# Patient Record
Sex: Female | Born: 1990 | Race: White | Hispanic: No | Marital: Married | Smoking: Former smoker
Health system: Southern US, Community
[De-identification: ages and names within clinical notes are randomized; demographics above are authoritative.]

## PROBLEM LIST (undated history)

## (undated) DIAGNOSIS — R87629 Unspecified abnormal cytological findings in specimens from vagina: Secondary | ICD-10-CM

## (undated) DIAGNOSIS — D649 Anemia, unspecified: Secondary | ICD-10-CM

## (undated) HISTORY — DX: Unspecified abnormal cytological findings in specimens from vagina: R87.629

## (undated) HISTORY — DX: Anemia, unspecified: D64.9

---

## 2010-10-06 HISTORY — PX: BREAST LUMPECTOMY: SHX2

## 2016-03-31 ENCOUNTER — Ambulatory Visit (INDEPENDENT_AMBULATORY_CARE_PROVIDER_SITE_OTHER): Payer: BLUE CROSS/BLUE SHIELD | Admitting: Obstetrics & Gynecology

## 2016-03-31 ENCOUNTER — Encounter: Payer: Self-pay | Admitting: Obstetrics & Gynecology

## 2016-03-31 VITALS — BP 128/77 | HR 97 | Wt 128.7 lb

## 2016-03-31 DIAGNOSIS — Z124 Encounter for screening for malignant neoplasm of cervix: Secondary | ICD-10-CM | POA: Diagnosis not present

## 2016-03-31 DIAGNOSIS — Z113 Encounter for screening for infections with a predominantly sexual mode of transmission: Secondary | ICD-10-CM

## 2016-03-31 DIAGNOSIS — N649 Disorder of breast, unspecified: Secondary | ICD-10-CM

## 2016-03-31 DIAGNOSIS — Z01419 Encounter for gynecological examination (general) (routine) without abnormal findings: Secondary | ICD-10-CM

## 2016-03-31 LAB — CBC
HCT: 29.5 % — ABNORMAL LOW (ref 35.0–45.0)
Hemoglobin: 7.8 g/dL — ABNORMAL LOW (ref 11.7–15.5)
MCH: 15.4 pg — ABNORMAL LOW (ref 27.0–33.0)
MCHC: 26.4 g/dL — AB (ref 32.0–36.0)
MCV: 58.3 fL — ABNORMAL LOW (ref 80.0–100.0)
PLATELETS: 193 10*3/uL (ref 140–400)
RBC: 5.06 MIL/uL (ref 3.80–5.10)
RDW: 20.1 % — ABNORMAL HIGH (ref 11.0–15.0)
WBC: 5.5 10*3/uL (ref 3.8–10.8)

## 2016-03-31 NOTE — Addendum Note (Signed)
Addended by: Jaynie CollinsANYANWU, UGONNA A on: 03/31/2016 01:48 PM   Modules accepted: Orders

## 2016-03-31 NOTE — Progress Notes (Addendum)
GYNECOLOGY CLINIC ANNUAL PREVENTATIVE CARE ENCOUNTER NOTE  Subjective:   Sharon Khan is a 25 y.o. G0 female here for a routine annual gynecologic exam.  Current complaints: none.   Denies abnormal vaginal bleeding, discharge, pelvic pain, or other gynecologic concerns.    Gynecologic History Patient's last menstrual period was 03/29/2016 (exact date). Contraception: abstinence, declines any other modality [place] were: normal  Obstetric History OB History  No data available    Past Medical History  Diagnosis Date  . Anemia   . Vaginal Pap smear, abnormal     Past Surgical History  Procedure Laterality Date  . Breast lumpectomy  2012    No current outpatient prescriptions on file prior to visit.   No current facility-administered medications on file prior to visit.    No Known Allergies  Social History   Social History  . Marital Status: Single    Spouse Name: N/A  . Number of Children: N/A  . Years of Education: N/A   Occupational History  . Not on file.   Social History Main Topics  . Smoking status: Current Every Day Smoker -- 1.00 packs/day for 4 years    Types: Cigarettes  . Smokeless tobacco: Never Used  . Alcohol Use: 0.0 oz/week    0 Standard drinks or equivalent per week     Comment: socially   . Drug Use: 3.00 per week    Special: Marijuana  . Sexual Activity:    Partners: Male    Pharmacist, hospitalBirth Control/ Protection: None   Other Topics Concern  . Not on file   Social History Narrative  . No narrative on file    History reviewed. No pertinent family history.  The following portions of the patient's history were reviewed and updated as appropriate: allergies, current medications, past family history, past medical history, past social history, past surgical history and problem list.  Review of Systems Pertinent items noted in HPI and remainder of comprehensive ROS otherwise negative.   Objective:  BP 128/77 mmHg   Pulse 97  Wt 128 lb 11.2 oz (58.378 kg)  LMP 03/29/2016 (Exact Date) CONSTITUTIONAL: Well-developed, well-nourished female in no acute distress.  HENT:  Normocephalic, atraumatic, External right and left ear normal. Oropharynx is clear and moist EYES: Conjunctivae and EOM are normal. Pupils are equal, round, and reactive to light. No scleral icterus.  NECK: Normal range of motion, supple, no masses.  Normal thyroid.  SKIN: Skin is warm and dry. No rash noted. Not diaphoretic. No erythema. No pallor. NEUROLOGIC: Alert and oriented to person, place, and time. Normal reflexes, muscle tone coordination. No cranial nerve deficit noted. PSYCHIATRIC: Normal mood and affect. Normal behavior. Normal judgment and thought content. CARDIOVASCULAR: Normal heart rate noted, regular rhythm RESPIRATORY: Clear to auscultation bilaterally. Effort and breath sounds normal, no problems with respiration noted. BREASTS: Symmetric in size. Abnormal breast lesion on right breast 1.5 cm mobile lesion at 6 o'clock position about 2 cm from nipple. No other masses, skin changes, nipple drainage, or lymphadenopathy. ABDOMEN: Soft, normal bowel sounds, no distention noted.  No tenderness, rebound or guarding.  PELVIC: Normal appearing external genitalia; normal appearing vaginal mucosa and cervix. Currently on period, moderate amount of blood in vagina cleared with scopettes..  Pap smear obtained.  Normal uterine size, no other palpable masses, no uterine or adnexal tenderness. MUSCULOSKELETAL: Normal range of motion. No tenderness.  No cyanosis, clubbing, or edema.  2+ distal pulses.  Assessment:  Annual  gynecologic examination with pap smear Desires STI screen R breast lesion   Plan:  Will follow up results of pap smear and manage accordingly. STI screen ordered. Breast imaging studies ordered for right breast lesion; will follow up results and manage accordingly. Routine preventative health maintenance measures  emphasized. Please refer to After Visit Summary for other counseling recommendations.    Jaynie CollinsUGONNA  Adriann Thau, MD, FACOG Attending Obstetrician & Gynecologist,  [facility]

## 2016-03-31 NOTE — Patient Instructions (Signed)
Preventive Care for Adults, Female A healthy lifestyle and preventive care can promote health and wellness. Preventive health guidelines for women include the following key practices.  A routine yearly physical is a good way to check with your health care provider about your health and preventive screening. It is a chance to share any concerns and updates on your health and to receive a thorough exam.  Visit your dentist for a routine exam and preventive care every 6 months. Brush your teeth twice a day and floss once a day. Good oral hygiene prevents tooth decay and gum disease.  The frequency of eye exams is based on your age, health, family medical history, use of contact lenses, and other factors. Follow your health care provider's recommendations for frequency of eye exams.  Eat a healthy diet. Foods like vegetables, fruits, whole grains, low-fat dairy products, and lean protein foods contain the nutrients you need without too many calories. Decrease your intake of foods high in solid fats, added sugars, and salt. Eat the right amount of calories for you.Get information about a proper diet from your health care provider, if necessary.  Regular physical exercise is one of the most important things you can do for your health. Most adults should get at least 150 minutes of moderate-intensity exercise (any activity that increases your heart rate and causes you to sweat) each week. In addition, most adults need muscle-strengthening exercises on 2 or more days a week.  Maintain a healthy weight. The body mass index (BMI) is a screening tool to identify possible weight problems. It provides an estimate of body fat based on height and weight. Your health care provider can find your BMI and can help you achieve or maintain a healthy weight.For adults 20 years and older:  A BMI below 18.5 is considered underweight.  A BMI of 18.5 to 24.9 is normal.  A BMI of 25 to 29.9 is considered overweight.  A  BMI of 30 and above is considered obese.  Maintain normal blood lipids and cholesterol levels by exercising and minimizing your intake of saturated fat. Eat a balanced diet with plenty of fruit and vegetables. Blood tests for lipids and cholesterol should begin at age 45 and be repeated every 5 years. If your lipid or cholesterol levels are high, you are over 50, or you are at high risk for heart disease, you may need your cholesterol levels checked more frequently.Ongoing high lipid and cholesterol levels should be treated with medicines if diet and exercise are not working.  If you smoke, find out from your health care provider how to quit. If you do not use tobacco, do not start.  Lung cancer screening is recommended for adults aged 45-80 years who are at high risk for developing lung cancer because of a history of smoking. A yearly low-dose CT scan of the lungs is recommended for people who have at least a 30-pack-year history of smoking and are a current smoker or have quit within the past 15 years. A pack year of smoking is smoking an average of 1 pack of cigarettes a day for 1 year (for example: 1 pack a day for 30 years or 2 packs a day for 15 years). Yearly screening should continue until the smoker has stopped smoking for at least 15 years. Yearly screening should be stopped for people who develop a health problem that would prevent them from having lung cancer treatment.  If you are pregnant, do not drink alcohol. If you are  breastfeeding, be very cautious about drinking alcohol. If you are not pregnant and choose to drink alcohol, do not have more than 1 drink per day. One drink is considered to be 12 ounces (355 mL) of beer, 5 ounces (148 mL) of wine, or 1.5 ounces (44 mL) of liquor.  Avoid use of street drugs. Do not share needles with anyone. Ask for help if you need support or instructions about stopping the use of drugs.  High blood pressure causes heart disease and increases the risk  of stroke. Your blood pressure should be checked at least every 1 to 2 years. Ongoing high blood pressure should be treated with medicines if weight loss and exercise do not work.  If you are 55-79 years old, ask your health care provider if you should take aspirin to prevent strokes.  Diabetes screening is done by taking a blood sample to check your blood glucose level after you have not eaten for a certain period of time (fasting). If you are not overweight and you do not have risk factors for diabetes, you should be screened once every 3 years starting at age 45. If you are overweight or obese and you are 40-70 years of age, you should be screened for diabetes every year as part of your cardiovascular risk assessment.  Breast cancer screening is essential preventive care for women. You should practice "breast self-awareness." This means understanding the normal appearance and feel of your breasts and may include breast self-examination. Any changes detected, no matter how small, should be reported to a health care provider. Women in their 20s and 30s should have a clinical breast exam (CBE) by a health care provider as part of a regular health exam every 1 to 3 years. After age 40, women should have a CBE every year. Starting at age 40, women should consider having a mammogram (breast X-ray test) every year. Women who have a family history of breast cancer should talk to their health care provider about genetic screening. Women at a high risk of breast cancer should talk to their health care providers about having an MRI and a mammogram every year.  Breast cancer gene (BRCA)-related cancer risk assessment is recommended for women who have family members with BRCA-related cancers. BRCA-related cancers include breast, ovarian, tubal, and peritoneal cancers. Having family members with these cancers may be associated with an increased risk for harmful changes (mutations) in the breast cancer genes BRCA1 and  BRCA2. Results of the assessment will determine the need for genetic counseling and BRCA1 and BRCA2 testing.  Your health care provider may recommend that you be screened regularly for cancer of the pelvic organs (ovaries, uterus, and vagina). This screening involves a pelvic examination, including checking for microscopic changes to the surface of your cervix (Pap test). You may be encouraged to have this screening done every 3 years, beginning at age 21.  For women ages 30-65, health care providers may recommend pelvic exams and Pap testing every 3 years, or they may recommend the Pap and pelvic exam, combined with testing for human papilloma virus (HPV), every 5 years. Some types of HPV increase your risk of cervical cancer. Testing for HPV may also be done on women of any age with unclear Pap test results.  Other health care providers may not recommend any screening for nonpregnant women who are considered low risk for pelvic cancer and who do not have symptoms. Ask your health care provider if a screening pelvic exam is right for   you.  If you have had past treatment for cervical cancer or a condition that could lead to cancer, you need Pap tests and screening for cancer for at least 20 years after your treatment. If Pap tests have been discontinued, your risk factors (such as having a new sexual partner) need to be reassessed to determine if screening should resume. Some women have medical problems that increase the chance of getting cervical cancer. In these cases, your health care provider may recommend more frequent screening and Pap tests.  Colorectal cancer can be detected and often prevented. Most routine colorectal cancer screening begins at the age of 50 years and continues through age 75 years. However, your health care provider may recommend screening at an earlier age if you have risk factors for colon cancer. On a yearly basis, your health care provider may provide home test kits to check  for hidden blood in the stool. Use of a small camera at the end of a tube, to directly examine the colon (sigmoidoscopy or colonoscopy), can detect the earliest forms of colorectal cancer. Talk to your health care provider about this at age 50, when routine screening begins. Direct exam of the colon should be repeated every 5-10 years through age 75 years, unless early forms of precancerous polyps or small growths are found.  People who are at an increased risk for hepatitis B should be screened for this virus. You are considered at high risk for hepatitis B if:  You were born in a country where hepatitis B occurs often. Talk with your health care provider about which countries are considered high risk.  Your parents were born in a high-risk country and you have not received a shot to protect against hepatitis B (hepatitis B vaccine).  You have HIV or AIDS.  You use needles to inject street drugs.  You live with, or have sex with, someone who has hepatitis B.  You get hemodialysis treatment.  You take certain medicines for conditions like cancer, organ transplantation, and autoimmune conditions.  Hepatitis C blood testing is recommended for all people born from 1945 through 1965 and any individual with known risks for hepatitis C.  Practice safe sex. Use condoms and avoid high-risk sexual practices to reduce the spread of sexually transmitted infections (STIs). STIs include gonorrhea, chlamydia, syphilis, trichomonas, herpes, HPV, and human immunodeficiency virus (HIV). Herpes, HIV, and HPV are viral illnesses that have no cure. They can result in disability, cancer, and death.  You should be screened for sexually transmitted illnesses (STIs) including gonorrhea and chlamydia if:  You are sexually active and are younger than 24 years.  You are older than 24 years and your health care provider tells you that you are at risk for this type of infection.  Your sexual activity has changed  since you were last screened and you are at an increased risk for chlamydia or gonorrhea. Ask your health care provider if you are at risk.  If you are at risk of being infected with HIV, it is recommended that you take a prescription medicine daily to prevent HIV infection. This is called preexposure prophylaxis (PrEP). You are considered at risk if:  You are sexually active and do not regularly use condoms or know the HIV status of your partner(s).  You take drugs by injection.  You are sexually active with a partner who has HIV.  Talk with your health care provider about whether you are at high risk of being infected with HIV. If   you choose to begin PrEP, you should first be tested for HIV. You should then be tested every 3 months for as long as you are taking PrEP.  Osteoporosis is a disease in which the bones lose minerals and strength with aging. This can result in serious bone fractures or breaks. The risk of osteoporosis can be identified using a bone density scan. Women ages 67 years and over and women at risk for fractures or osteoporosis should discuss screening with their health care providers. Ask your health care provider whether you should take a calcium supplement or vitamin D to reduce the rate of osteoporosis.  Menopause can be associated with physical symptoms and risks. Hormone replacement therapy is available to decrease symptoms and risks. You should talk to your health care provider about whether hormone replacement therapy is right for you.  Use sunscreen. Apply sunscreen liberally and repeatedly throughout the day. You should seek shade when your shadow is shorter than you. Protect yourself by wearing long sleeves, pants, a wide-brimmed hat, and sunglasses year round, whenever you are outdoors.  Once a month, do a whole body skin exam, using a mirror to look at the skin on your back. Tell your health care provider of new moles, moles that have irregular borders, moles that  are larger than a pencil eraser, or moles that have changed in shape or color.  Stay current with required vaccines (immunizations).  Influenza vaccine. All adults should be immunized every year.  Tetanus, diphtheria, and acellular pertussis (Td, Tdap) vaccine. Pregnant women should receive 1 dose of Tdap vaccine during each pregnancy. The dose should be obtained regardless of the length of time since the last dose. Immunization is preferred during the 27th-36th week of gestation. An adult who has not previously received Tdap or who does not know her vaccine status should receive 1 dose of Tdap. This initial dose should be followed by tetanus and diphtheria toxoids (Td) booster doses every 10 years. Adults with an unknown or incomplete history of completing a 3-dose immunization series with Td-containing vaccines should begin or complete a primary immunization series including a Tdap dose. Adults should receive a Td booster every 10 years.  Varicella vaccine. An adult without evidence of immunity to varicella should receive 2 doses or a second dose if she has previously received 1 dose. Pregnant females who do not have evidence of immunity should receive the first dose after pregnancy. This first dose should be obtained before leaving the health care facility. The second dose should be obtained 4-8 weeks after the first dose.  Human papillomavirus (HPV) vaccine. Females aged 13-26 years who have not received the vaccine previously should obtain the 3-dose series. The vaccine is not recommended for use in pregnant females. However, pregnancy testing is not needed before receiving a dose. If a female is found to be pregnant after receiving a dose, no treatment is needed. In that case, the remaining doses should be delayed until after the pregnancy. Immunization is recommended for any person with an immunocompromised condition through the age of 61 years if she did not get any or all doses earlier. During the  3-dose series, the second dose should be obtained 4-8 weeks after the first dose. The third dose should be obtained 24 weeks after the first dose and 16 weeks after the second dose.  Zoster vaccine. One dose is recommended for adults aged 30 years or older unless certain conditions are present.  Measles, mumps, and rubella (MMR) vaccine. Adults born  before 1957 generally are considered immune to measles and mumps. Adults born in 1957 or later should have 1 or more doses of MMR vaccine unless there is a contraindication to the vaccine or there is laboratory evidence of immunity to each of the three diseases. A routine second dose of MMR vaccine should be obtained at least 28 days after the first dose for students attending postsecondary schools, health care workers, or international travelers. People who received inactivated measles vaccine or an unknown type of measles vaccine during 1963-1967 should receive 2 doses of MMR vaccine. People who received inactivated mumps vaccine or an unknown type of mumps vaccine before 1979 and are at high risk for mumps infection should consider immunization with 2 doses of MMR vaccine. For females of childbearing age, rubella immunity should be determined. If there is no evidence of immunity, females who are not pregnant should be vaccinated. If there is no evidence of immunity, females who are pregnant should delay immunization until after pregnancy. Unvaccinated health care workers born before 1957 who lack laboratory evidence of measles, mumps, or rubella immunity or laboratory confirmation of disease should consider measles and mumps immunization with 2 doses of MMR vaccine or rubella immunization with 1 dose of MMR vaccine.  Pneumococcal 13-valent conjugate (PCV13) vaccine. When indicated, a person who is uncertain of his immunization history and has no record of immunization should receive the PCV13 vaccine. All adults 65 years of age and older should receive this  vaccine. An adult aged 19 years or older who has certain medical conditions and has not been previously immunized should receive 1 dose of PCV13 vaccine. This PCV13 should be followed with a dose of pneumococcal polysaccharide (PPSV23) vaccine. Adults who are at high risk for pneumococcal disease should obtain the PPSV23 vaccine at least 8 weeks after the dose of PCV13 vaccine. Adults older than 27 years of age who have normal immune system function should obtain the PPSV23 vaccine dose at least 1 year after the dose of PCV13 vaccine.  Pneumococcal polysaccharide (PPSV23) vaccine. When PCV13 is also indicated, PCV13 should be obtained first. All adults aged 65 years and older should be immunized. An adult younger than age 65 years who has certain medical conditions should be immunized. Any person who resides in a nursing home or long-term care facility should be immunized. An adult smoker should be immunized. People with an immunocompromised condition and certain other conditions should receive both PCV13 and PPSV23 vaccines. People with human immunodeficiency virus (HIV) infection should be immunized as soon as possible after diagnosis. Immunization during chemotherapy or radiation therapy should be avoided. Routine use of PPSV23 vaccine is not recommended for American Indians, [place] Natives, or people younger than 65 years unless there are medical conditions that require PPSV23 vaccine. When indicated, people who have unknown immunization and have no record of immunization should receive PPSV23 vaccine. One-time revaccination 5 years after the first dose of PPSV23 is recommended for people aged 19-64 years who have chronic kidney failure, nephrotic syndrome, asplenia, or immunocompromised conditions. People who received 1-2 doses of PPSV23 before age 65 years should receive another dose of PPSV23 vaccine at age 65 years or later if at least 5 years have passed since the previous dose. Doses of PPSV23 are not  needed for people immunized with PPSV23 at or after age 65 years.  Meningococcal vaccine. Adults with asplenia or persistent complement component deficiencies should receive 2 doses of quadrivalent meningococcal conjugate (MenACWY-D) vaccine. The doses should be obtained   at least 2 months apart. Microbiologists working with certain meningococcal bacteria, [place] recruits, people at risk during an outbreak, and people who travel to or live in countries with a high rate of meningitis should be immunized. A first-year college student up through age 34 years who is living in a residence hall should receive a dose if she did not receive a dose on or after her 16th birthday. Adults who have certain high-risk conditions should receive one or more doses of vaccine.  Hepatitis A vaccine. Adults who wish to be protected from this disease, have certain high-risk conditions, work with hepatitis A-infected animals, work in hepatitis A research labs, or travel to or work in countries with a high rate of hepatitis A should be immunized. Adults who were previously unvaccinated and who anticipate close contact with an international adoptee during the first 60 days after arrival in the [place] from a country with a high rate of hepatitis A should be immunized.  Hepatitis B vaccine. Adults who wish to be protected from this disease, have certain high-risk conditions, may be exposed to blood or other infectious body fluids, are household contacts or sex partners of hepatitis B positive people, are clients or workers in certain care facilities, or travel to or work in countries with a high rate of hepatitis B should be immunized.  Haemophilus influenzae type b (Hib) vaccine. A previously unvaccinated person with asplenia or sickle cell disease or having a scheduled splenectomy should receive 1 dose of Hib vaccine. Regardless of previous immunization, a recipient of a hematopoietic stem cell transplant should receive a  3-dose series 6-12 months after her successful transplant. Hib vaccine is not recommended for adults with HIV infection. Preventive Services / Frequency Ages 35 to 4 years  Blood pressure check.** / Every 3-5 years.  Lipid and cholesterol check.** / Every 5 years beginning at age 60.  Clinical breast exam.** / Every 3 years for women in their 71s and 10s.  BRCA-related cancer risk assessment.** / For women who have family members with a BRCA-related cancer (breast, ovarian, tubal, or peritoneal cancers).  Pap test.** / Every 2 years from ages 76 through 26. Every 3 years starting at age 61 through age 76 or 93 with a history of 3 consecutive normal Pap tests.  HPV screening.** / Every 3 years from ages 37 through ages 60 to 51 with a history of 3 consecutive normal Pap tests.  Hepatitis C blood test.** / For any individual with known risks for hepatitis C.  Skin self-exam. / Monthly.  Influenza vaccine. / Every year.  Tetanus, diphtheria, and acellular pertussis (Tdap, Td) vaccine.** / Consult your health care provider. Pregnant women should receive 1 dose of Tdap vaccine during each pregnancy. 1 dose of Td every 10 years.  Varicella vaccine.** / Consult your health care provider. Pregnant females who do not have evidence of immunity should receive the first dose after pregnancy.  HPV vaccine. / 3 doses over 6 months, if 93 and younger. The vaccine is not recommended for use in pregnant females. However, pregnancy testing is not needed before receiving a dose.  Measles, mumps, rubella (MMR) vaccine.** / You need at least 1 dose of MMR if you were born in 1957 or later. You may also need a 2nd dose. For females of childbearing age, rubella immunity should be determined. If there is no evidence of immunity, females who are not pregnant should be vaccinated. If there is no evidence of immunity, females who are  pregnant should delay immunization until after pregnancy.  Pneumococcal  13-valent conjugate (PCV13) vaccine.** / Consult your health care provider.  Pneumococcal polysaccharide (PPSV23) vaccine.** / 1 to 2 doses if you smoke cigarettes or if you have certain conditions.  Meningococcal vaccine.** / 1 dose if you are age 68 to 8 years and a Market researcher living in a residence hall, or have one of several medical conditions, you need to get vaccinated against meningococcal disease. You may also need additional booster doses.  Hepatitis A vaccine.** / Consult your health care provider.  Hepatitis B vaccine.** / Consult your health care provider.  Haemophilus influenzae type b (Hib) vaccine.** / Consult your health care provider. Ages 7 to 53 years  Blood pressure check.** / Every year.  Lipid and cholesterol check.** / Every 5 years beginning at age 25 years.  Lung cancer screening. / Every year if you are aged 11-80 years and have a 30-pack-year history of smoking and currently smoke or have quit within the past 15 years. Yearly screening is stopped once you have quit smoking for at least 15 years or develop a health problem that would prevent you from having lung cancer treatment.  Clinical breast exam.** / Every year after age 48 years.  BRCA-related cancer risk assessment.** / For women who have family members with a BRCA-related cancer (breast, ovarian, tubal, or peritoneal cancers).  Mammogram.** / Every year beginning at age 41 years and continuing for as long as you are in good health. Consult with your health care provider.  Pap test.** / Every 3 years starting at age 65 years through age 37 or 70 years with a history of 3 consecutive normal Pap tests.  HPV screening.** / Every 3 years from ages 72 years through ages 60 to 40 years with a history of 3 consecutive normal Pap tests.  Fecal occult blood test (FOBT) of stool. / Every year beginning at age 21 years and continuing until age 5 years. You may not need to do this test if you get  a colonoscopy every 10 years.  Flexible sigmoidoscopy or colonoscopy.** / Every 5 years for a flexible sigmoidoscopy or every 10 years for a colonoscopy beginning at age 35 years and continuing until age 48 years.  Hepatitis C blood test.** / For all people born from [birth year] through 1965 and any individual with known risks for hepatitis C.  Skin self-exam. / Monthly.  Influenza vaccine. / Every year.  Tetanus, diphtheria, and acellular pertussis (Tdap/Td) vaccine.** / Consult your health care provider. Pregnant women should receive 1 dose of Tdap vaccine during each pregnancy. 1 dose of Td every 10 years.  Varicella vaccine.** / Consult your health care provider. Pregnant females who do not have evidence of immunity should receive the first dose after pregnancy.  Zoster vaccine.** / 1 dose for adults aged 30 years or older.  Measles, mumps, rubella (MMR) vaccine.** / You need at least 1 dose of MMR if you were born in 1957 or later. You may also need a second dose. For females of childbearing age, rubella immunity should be determined. If there is no evidence of immunity, females who are not pregnant should be vaccinated. If there is no evidence of immunity, females who are pregnant should delay immunization until after pregnancy.  Pneumococcal 13-valent conjugate (PCV13) vaccine.** / Consult your health care provider.  Pneumococcal polysaccharide (PPSV23) vaccine.** / 1 to 2 doses if you smoke cigarettes or if you have certain conditions.  Meningococcal vaccine.** /  Consult your health care provider.  Hepatitis A vaccine.** / Consult your health care provider.  Hepatitis B vaccine.** / Consult your health care provider.  Haemophilus influenzae type b (Hib) vaccine.** / Consult your health care provider. Ages 64 years and over  Blood pressure check.** / Every year.  Lipid and cholesterol check.** / Every 5 years beginning at age 23 years.  Lung cancer screening. / Every year if you  are aged 16-80 years and have a 30-pack-year history of smoking and currently smoke or have quit within the past 15 years. Yearly screening is stopped once you have quit smoking for at least 15 years or develop a health problem that would prevent you from having lung cancer treatment.  Clinical breast exam.** / Every year after age 74 years.  BRCA-related cancer risk assessment.** / For women who have family members with a BRCA-related cancer (breast, ovarian, tubal, or peritoneal cancers).  Mammogram.** / Every year beginning at age 44 years and continuing for as long as you are in good health. Consult with your health care provider.  Pap test.** / Every 3 years starting at age 58 years through age 22 or 39 years with 3 consecutive normal Pap tests. Testing can be stopped between 65 and 70 years with 3 consecutive normal Pap tests and no abnormal Pap or HPV tests in the past 10 years.  HPV screening.** / Every 3 years from ages 64 years through ages 70 or 61 years with a history of 3 consecutive normal Pap tests. Testing can be stopped between 65 and 70 years with 3 consecutive normal Pap tests and no abnormal Pap or HPV tests in the past 10 years.  Fecal occult blood test (FOBT) of stool. / Every year beginning at age 40 years and continuing until age 27 years. You may not need to do this test if you get a colonoscopy every 10 years.  Flexible sigmoidoscopy or colonoscopy.** / Every 5 years for a flexible sigmoidoscopy or every 10 years for a colonoscopy beginning at age 7 years and continuing until age 32 years.  Hepatitis C blood test.** / For all people born from [birth year] through 1967 and any individual with known risks for hepatitis C.  Osteoporosis screening.** / A one-time screening for women ages 30 years and over and women at risk for fractures or osteoporosis.  Skin self-exam. / Monthly.  Influenza vaccine. / Every year.  Tetanus, diphtheria, and acellular pertussis (Tdap/Td)  vaccine.** / 1 dose of Td every 10 years.  Varicella vaccine.** / Consult your health care provider.  Zoster vaccine.** / 1 dose for adults aged 35 years or older.  Pneumococcal 13-valent conjugate (PCV13) vaccine.** / Consult your health care provider.  Pneumococcal polysaccharide (PPSV23) vaccine.** / 1 dose for all adults aged 46 years and older.  Meningococcal vaccine.** / Consult your health care provider.  Hepatitis A vaccine.** / Consult your health care provider.  Hepatitis B vaccine.** / Consult your health care provider.  Haemophilus influenzae type b (Hib) vaccine.** / Consult your health care provider. ** Family history and personal history of risk and conditions may change your health care provider's recommendations.   This information is not intended to replace advice given to you by your health care provider. Make sure you discuss any questions you have with your health care provider.   Document Released: 11/18/2001 Document Revised: 10/13/2014 Document Reviewed: 02/17/2011 Elsevier Interactive Patient Education [employer].

## 2016-04-01 LAB — GC/CHLAMYDIA PROBE AMP (~~LOC~~) NOT AT ARMC
CHLAMYDIA, DNA PROBE: NEGATIVE
NEISSERIA GONORRHEA: NEGATIVE

## 2016-04-01 LAB — HEPATITIS B SURFACE ANTIGEN: Hepatitis B Surface Ag: NEGATIVE

## 2016-04-01 LAB — HIV ANTIBODY (ROUTINE TESTING W REFLEX): HIV: NONREACTIVE

## 2016-04-01 LAB — RPR

## 2016-04-02 LAB — CYTOLOGY - PAP

## 2016-04-04 ENCOUNTER — Ambulatory Visit
Admission: RE | Admit: 2016-04-04 | Discharge: 2016-04-04 | Disposition: A | Discharge status: Home / Self Care | Payer: BLUE CROSS/BLUE SHIELD | Source: Ambulatory Visit | Attending: Obstetrics & Gynecology | Admitting: Obstetrics & Gynecology

## 2016-04-04 DIAGNOSIS — N649 Disorder of breast, unspecified: Secondary | ICD-10-CM

## 2016-09-01 ENCOUNTER — Other Ambulatory Visit: Payer: Self-pay | Admitting: Obstetrics & Gynecology

## 2016-09-01 DIAGNOSIS — N63 Unspecified lump in unspecified breast: Secondary | ICD-10-CM

## 2016-09-19 ENCOUNTER — Other Ambulatory Visit: Payer: Self-pay

## 2016-09-19 ENCOUNTER — Other Ambulatory Visit: Payer: Self-pay | Admitting: Obstetrics & Gynecology

## 2016-09-19 DIAGNOSIS — N63 Unspecified lump in unspecified breast: Secondary | ICD-10-CM

## 2016-09-22 ENCOUNTER — Ambulatory Visit
Admission: RE | Admit: 2016-09-22 | Discharge: 2016-09-22 | Disposition: A | Discharge status: Home / Self Care | Payer: BLUE CROSS/BLUE SHIELD | Source: Ambulatory Visit | Attending: Obstetrics & Gynecology | Admitting: Obstetrics & Gynecology

## 2016-09-22 DIAGNOSIS — N63 Unspecified lump in unspecified breast: Secondary | ICD-10-CM

## 2016-12-11 ENCOUNTER — Emergency Department (HOSPITAL_BASED_OUTPATIENT_CLINIC_OR_DEPARTMENT_OTHER)
Admission: EM | Admit: 2016-12-11 | Discharge: 2016-12-11 | Disposition: A | Discharge status: Home / Self Care | Payer: BLUE CROSS/BLUE SHIELD | Attending: Emergency Medicine | Admitting: Emergency Medicine

## 2016-12-11 ENCOUNTER — Emergency Department (HOSPITAL_BASED_OUTPATIENT_CLINIC_OR_DEPARTMENT_OTHER): Payer: BLUE CROSS/BLUE SHIELD

## 2016-12-11 ENCOUNTER — Encounter (HOSPITAL_BASED_OUTPATIENT_CLINIC_OR_DEPARTMENT_OTHER): Payer: Self-pay | Admitting: *Deleted

## 2016-12-11 DIAGNOSIS — N760 Acute vaginitis: Secondary | ICD-10-CM | POA: Insufficient documentation

## 2016-12-11 DIAGNOSIS — F17210 Nicotine dependence, cigarettes, uncomplicated: Secondary | ICD-10-CM | POA: Diagnosis not present

## 2016-12-11 DIAGNOSIS — F1290 Cannabis use, unspecified, uncomplicated: Secondary | ICD-10-CM | POA: Diagnosis not present

## 2016-12-11 DIAGNOSIS — R1031 Right lower quadrant pain: Secondary | ICD-10-CM | POA: Diagnosis present

## 2016-12-11 DIAGNOSIS — B9689 Other specified bacterial agents as the cause of diseases classified elsewhere: Secondary | ICD-10-CM

## 2016-12-11 LAB — COMPREHENSIVE METABOLIC PANEL
ALBUMIN: 4.1 g/dL (ref 3.5–5.0)
ALK PHOS: 55 U/L (ref 38–126)
ALT: 10 U/L — ABNORMAL LOW (ref 14–54)
ANION GAP: 5 (ref 5–15)
AST: 17 U/L (ref 15–41)
BUN: 9 mg/dL (ref 6–20)
CALCIUM: 8.4 mg/dL — AB (ref 8.9–10.3)
CO2: 24 mmol/L (ref 22–32)
Chloride: 102 mmol/L (ref 101–111)
Creatinine, Ser: 0.56 mg/dL (ref 0.44–1.00)
GFR calc non Af Amer: >60 mL/min (ref >60)
GLUCOSE: 100 mg/dL — AB (ref 65–99)
POTASSIUM: 3.5 mmol/L (ref 3.5–5.1)
Sodium: 131 mmol/L — ABNORMAL LOW (ref 135–145)
TOTAL PROTEIN: 7 g/dL (ref 6.5–8.1)
Total Bilirubin: 0.4 mg/dL (ref 0.3–1.2)

## 2016-12-11 LAB — URINALYSIS, ROUTINE W REFLEX MICROSCOPIC
BILIRUBIN URINE: NEGATIVE
Glucose, UA: NEGATIVE mg/dL
HGB URINE DIPSTICK: NEGATIVE
KETONES UR: NEGATIVE mg/dL
Leukocytes, UA: NEGATIVE
NITRITE: NEGATIVE
PROTEIN: NEGATIVE mg/dL
Specific Gravity, Urine: 1.02 (ref 1.005–1.030)
pH: 7 (ref 5.0–8.0)

## 2016-12-11 LAB — CBC WITH DIFFERENTIAL/PLATELET
Basophils Absolute: 0 10*3/uL (ref 0.0–0.1)
Basophils Relative: 1 %
EOS ABS: 0.1 10*3/uL (ref 0.0–0.7)
Eosinophils Relative: 3 %
HCT: 26.7 % — ABNORMAL LOW (ref 36.0–46.0)
Hemoglobin: 7.6 g/dL — ABNORMAL LOW (ref 12.0–15.0)
LYMPHS PCT: 23 %
Lymphs Abs: 0.9 10*3/uL (ref 0.7–4.0)
MCH: 15.9 pg — ABNORMAL LOW (ref 26.0–34.0)
MCHC: 28.5 g/dL — AB (ref 30.0–36.0)
MCV: 56 fL — ABNORMAL LOW (ref 78.0–100.0)
Monocytes Absolute: 0.4 10*3/uL (ref 0.1–1.0)
Monocytes Relative: 9 %
NEUTROS ABS: 2.5 10*3/uL (ref 1.7–7.7)
Neutrophils Relative %: 64 %
PLATELETS: 142 10*3/uL — AB (ref 150–400)
RBC: 4.77 MIL/uL (ref 3.87–5.11)
RDW: 21.9 % — ABNORMAL HIGH (ref 11.5–15.5)
WBC: 3.9 10*3/uL — AB (ref 4.0–10.5)

## 2016-12-11 LAB — WET PREP, GENITAL
Sperm: NONE SEEN
TRICH WET PREP: NONE SEEN
YEAST WET PREP: NONE SEEN

## 2016-12-11 LAB — PREGNANCY, URINE: PREG TEST UR: NEGATIVE

## 2016-12-11 LAB — LIPASE, BLOOD: Lipase: 23 U/L (ref 11–51)

## 2016-12-11 MED ORDER — SODIUM CHLORIDE 0.9 % IV BOLUS (SEPSIS)
1000.0000 mL | Freq: Once | INTRAVENOUS | Status: AC
  Administered 2016-12-11: 1000 mL via INTRAVENOUS

## 2016-12-11 MED ORDER — IOPAMIDOL (ISOVUE-300) INJECTION 61%
100.0000 mL | Freq: Once | INTRAVENOUS | Status: AC | PRN
  Administered 2016-12-11: 100 mL via INTRAVENOUS

## 2016-12-11 MED ORDER — KETOROLAC TROMETHAMINE 30 MG/ML IJ SOLN
30.0000 mg | Freq: Once | INTRAMUSCULAR | Status: AC
  Administered 2016-12-11: 30 mg via INTRAVENOUS
  Filled 2016-12-11: qty 1

## 2016-12-11 MED ORDER — NAPROXEN 500 MG PO TABS: 500.0000 mg | ORAL_TABLET | Freq: Two times a day (BID) | ORAL | 0 refills | Status: DC

## 2016-12-11 MED ORDER — METRONIDAZOLE 500 MG PO TABS: 500.0000 mg | ORAL_TABLET | Freq: Two times a day (BID) | ORAL | 0 refills | Status: DC

## 2016-12-11 MED FILL — metroNIDAZOLE 500 MG TABS: 500 | 7 days supply | Qty: 14 | Fill #0

## 2016-12-11 MED FILL — NAPROXEN 500 MG TABLET: 500 | 15 days supply | Qty: 30 | Fill #0

## 2016-12-11 NOTE — ED Triage Notes (Signed)
Abdominal pain in her lower abdomen into her right flank. Pain x 1 month ago. Urgency and scanty urine.

## 2016-12-11 NOTE — Discharge Instructions (Signed)
Pain: You may take ibuprofen or naproxen as needed for pain. Take these medications with food to avoid upset stomach. Choose only one of these medications, do not take them together. Bacterial vaginosis: Please take all of your antibiotics until finished!   You may develop abdominal discomfort or diarrhea from the antibiotic.  You may help offset this with probiotics which you can buy or get in yogurt. Do not eat or take the probiotics until 2 hours after your antibiotic.  Anemia: Your anemia seems to be consistent with previous levels. You should be taking your iron supplement, as prescribed.  Hydration: Symptoms will be intensified and complicated by dehydration. Dehydration can also extend the duration of symptoms. Drink plenty of fluids and get plenty of rest. You should be drinking at least half a liter of water an hour to stay hydrated. Electrolyte drinks are also encouraged. You should be drinking enough fluids to make your urine light yellow, almost clear. If this is not the case, you are not drinking enough water. Follow up: You should follow up with a primary care provider as soon as possible, both to establish care and to have your labs rechecked to make sure all your levels are stable.  Return to the ED: Should any symptoms worsen or other concerning symptoms arise, please proceed [facility] the emergency department at [facility].

## 2016-12-11 NOTE — ED Provider Notes (Signed)
[facility]   CSN: [id number] Arrival date & time: 12/11/16  1143     History   Chief Complaint Chief Complaint  Patient presents with  . Abdominal Pain    HPI Sharon Khan is a 26 y.o. female.  HPI   Sharon Khan is a 26 y.o. female, with a history of Anemia and abnormal Pap smear, presenting to the ED with Back pain and abdominal pain consistently worsening over the last month. Pain in the abdomen is sharp, 8 out of 10, and does not seem to radiate. She indicates this pain is in the right upper and right lower quadrants, worse in the right lower quadrant. Indicates increased white vaginal discharge that began around the same time. Pain in the back is in the right lumbar area and sometimes extending into the right scapular region. She feels as though the back and the abdominal pains are connected.  Patient also endorses some urinary urgency and frequency over the same time period. Has not tried any medications or other therapies. Endorses normal bowel movements. She is sexually active with 1 female partner. Denies nausea/vomiting, fever/chills, abnormal vaginal bleeding, or any other complaints. LMP three weeks ago.   Past Medical History:  Diagnosis Date  . Anemia   . Vaginal Pap smear, abnormal     There are no active problems to display for this patient.   Past Surgical History:  Procedure Laterality Date  . BREAST LUMPECTOMY  2012    OB History    No data available       Home Medications    Prior to Admission medications   Medication Sig Start Date End Date Taking? Authorizing Provider  ferrous sulfate 325 (65 FE) MG tablet Take 325 mg by mouth daily with breakfast.    Historical Provider, MD  metroNIDAZOLE (FLAGYL) 500 MG tablet Take 1 tablet (500 mg total) by mouth 2 (two) times daily. 12/11/16   Shawn C Joy, PA-C  naproxen (NAPROSYN) 500 MG tablet Take 1 tablet (500 mg total) by mouth 2 (two) times daily. 12/11/16   Anselm Pancoast, PA-C     Family History No family history on file.  Social History Social History  Substance Use Topics  . Smoking status: Current Every Day Smoker    Packs/day: 1.00    Years: 4.00    Types: Cigarettes  . Smokeless tobacco: Never Used  . Alcohol use 0.0 oz/week     Comment: socially      Allergies   Patient has no known allergies.   Review of Systems Review of Systems  Constitutional: Negative for chills and fever.  Respiratory: Negative for shortness of breath.   Cardiovascular: Negative for chest pain.  Gastrointestinal: Positive for abdominal pain. Negative for blood in stool, constipation, diarrhea, nausea and vomiting.  Genitourinary: Positive for frequency and urgency. Negative for difficulty urinating, dysuria, vaginal bleeding and vaginal discharge.  Musculoskeletal: Positive for back pain. Negative for neck pain.  Neurological: Negative for dizziness, weakness, light-headedness, numbness and headaches.  All other systems reviewed and are negative.   Physical Exam Updated Vital Signs BP 139/71 (BP Location: Left Arm)   Pulse 111   Temp 98.3 F (36.8 C) (Oral)   Resp 16   Ht 5\' 4"  (1.626 m)   Wt 57.6 kg   LMP 11/15/2016   SpO2 100%   BMI 21.80 kg/m   Physical Exam  Constitutional: She appears well-developed and well-nourished. No distress.  HENT:  Head: Normocephalic and atraumatic.  Eyes: Conjunctivae are normal.  Neck: Normal range of motion. Neck supple.  Cardiovascular: Normal rate, regular rhythm, normal heart sounds and intact distal pulses.   Pulmonary/Chest: Effort normal and breath sounds normal. No respiratory distress.  Abdominal: Soft. There is tenderness in the right upper quadrant and right lower quadrant. There is no guarding.  Palpation in the left lower quadrant elicits pain in the right lower quadrant, "as if the pain is being pushed over" to the right lower quadrant.  Genitourinary:  Genitourinary Comments: External genitalia  normal Vagina with discharge - Thin, white moderate discharge in vaginal vault Cervix  normal negative for cervical motion tenderness Adnexa palpated, no masses, positive for tenderness noted on the right Bladder palpated negative for tenderness Uterus palpated no masses, positive for tenderness  Otherwise normal female genitalia. RN, Jerrye Beavers, served as Biomedical engineer during exam.  Musculoskeletal: She exhibits no edema.  Lymphadenopathy:    She has no cervical adenopathy.       Right: No inguinal adenopathy present.       Left: No inguinal adenopathy present.  Neurological: She is alert.  No sensory deficits. Strength 5/5 in all extremities. No gait disturbance. Coordination intact including heel to shin and finger to nose.   Skin: Skin is warm and dry. She is not diaphoretic.  Psychiatric: She has a normal mood and affect. Her behavior is normal.  Nursing note and vitals reviewed.    ED Treatments / Results  Labs (all labs ordered are listed, but only abnormal results are displayed) Labs Reviewed  WET PREP, GENITAL - Abnormal; Notable for the following:       Result Value   Clue Cells Wet Prep HPF POC PRESENT (*)    WBC, Wet Prep HPF POC MANY (*)    All other components within normal limits  URINALYSIS, ROUTINE W REFLEX MICROSCOPIC - Abnormal; Notable for the following:    APPearance CLOUDY (*)    All other components within normal limits  COMPREHENSIVE METABOLIC PANEL - Abnormal; Notable for the following:    Sodium 131 (*)    Glucose, Bld 100 (*)    Calcium 8.4 (*)    ALT 10 (*)    All other components within normal limits  CBC WITH DIFFERENTIAL/PLATELET - Abnormal; Notable for the following:    WBC 3.9 (*)    Hemoglobin 7.6 (*)    HCT 26.7 (*)    MCV 56.0 (*)    MCH 15.9 (*)    MCHC 28.5 (*)    RDW 21.9 (*)    Platelets 142 (*)    All other components within normal limits  PREGNANCY, URINE  RPR  HIV ANTIBODY (ROUTINE TESTING)  LIPASE, BLOOD  GC/CHLAMYDIA PROBE AMP  ([place]) NOT AT [facility]   Hemoglobin  Date Value Ref Range Status  12/11/2016 7.6 (L) 12.0 - 15.0 g/dL Final  91/47/8295 7.8 (L) 11.7 - 15.5 g/dL Final    EKG  EKG Interpretation None       Radiology US Transvaginal Non-ob  Result Date: 12/11/2016 CLINICAL DATA:  One month of right adnexal pain EXAM: TRANSABDOMINAL AND TRANSVAGINAL ULTRASOUND OF PELVIS TECHNIQUE: Both transabdominal and transvaginal ultrasound examinations of the pelvis were performed. Transabdominal technique was performed for global imaging of the pelvis including uterus, ovaries, adnexal regions, and pelvic cul-de-sac. It was necessary to proceed with endovaginal exam following the transabdominal exam to visualize the uterus and right ovary. COMPARISON:  None in PACs FINDINGS: Uterus Measurements: 8.5 x 4.1 x 5.2 cm.  No fibroids or other mass visualized. Endometrium Thickness: 1.6 mm.  No focal abnormality visualized. Right ovary Measurements: 3.4 x 1.8 x 2.7 cm. Normal appearance. No adnexal mass. Left ovary Measurements: 3.6 x 2.6 x 2 cm. There is a slightly hyperechoic region within the left ovary measuring 1.6 x 1.2 x 1.1 cm. This may reflect the sequelae of a hemorrhagic cyst but it is nonspecific. It is not as hyperechoic as would be expected with a dermoid. Other findings There is a moderate amount of free pelvic fluid. IMPRESSION: No right adnexal mass.  Normal appearance of the right ovary. Hyperechoic region in the left ovary measuring 1.6 x 1.2 x 1.1 cm which could reflect the sequelae of previous hemorrhagic cyst. A small solid mass is not excluded. There is a moderate amount of free pelvic fluid as well. Follow-up ultrasound in 8-12 weeks is recommended to reassess this structure. Normal appearance of the uterus and the endometrium. Electronically Signed   By: David  Swazil[place]Jordan M.D.   On: 12/11/2016 15:19   [place] Pelvis Complete  Result Date: 12/11/2016 CLINICAL DATA:  One month of right adnexal pain EXAM:  TRANSABDOMINAL AND TRANSVAGINAL ULTRASOUND OF PELVIS TECHNIQUE: Both transabdominal and transvaginal ultrasound examinations of the pelvis were performed. Transabdominal technique was performed for global imaging of the pelvis including uterus, ovaries, adnexal regions, and pelvic cul-de-sac. It was necessary to proceed with endovaginal exam following the transabdominal exam to visualize the uterus and right ovary. COMPARISON:  None in PACs FINDINGS: Uterus Measurements: 8.5 x 4.1 x 5.2 cm. No fibroids or other mass visualized. Endometrium Thickness: 1.6 mm.  No focal abnormality visualized. Right ovary Measurements: 3.4 x 1.8 x 2.7 cm. Normal appearance. No adnexal mass. Left ovary Measurements: 3.6 x 2.6 x 2 cm. There is a slightly hyperechoic region within the left ovary measuring 1.6 x 1.2 x 1.1 cm. This may reflect the sequelae of a hemorrhagic cyst but it is nonspecific. It is not as hyperechoic as would be expected with a dermoid. Other findings There is a moderate amount of free pelvic fluid. IMPRESSION: No right adnexal mass.  Normal appearance of the right ovary. Hyperechoic region in the left ovary measuring 1.6 x 1.2 x 1.1 cm which could reflect the sequelae of previous hemorrhagic cyst. A small solid mass is not excluded. There is a moderate amount of free pelvic fluid as well. Follow-up ultrasound in 8-12 weeks is recommended to reassess this structure. Normal appearance of the uterus and the endometrium. Electronically Signed   By: David  [place]Jordan M.D.   On: 12/11/2016 15:19   Ct Abdomen Pelvis W Contrast  Result Date: 12/11/2016 CLINICAL DATA:  Abdomen pain in the lower abdomen to the right flank EXAM: CT ABDOMEN AND PELVIS WITH CONTRAST TECHNIQUE: Multidetector CT imaging of the abdomen and pelvis was performed using the standard protocol following bolus administration of intravenous contrast. CONTRAST:  100mL ISOVUE-300 IOPAMIDOL (ISOVUE-300) INJECTION 61% COMPARISON:  Ultrasound 12/11/2016  FINDINGS: Lower chest: Lung bases demonstrate no acute consolidation or pleural effusion. Normal heart size. Hepatobiliary: No focal liver abnormality is seen. No gallstones, gallbladder wall thickening, or biliary dilatation. Pancreas: Unremarkable. No pancreatic ductal dilatation or surrounding inflammatory changes. Spleen: Borderline enlarged at 13 cm. Adrenals/Urinary Tract: Adrenal glands are unremarkable. Kidneys are normal, without renal calculi, focal lesion, or hydronephrosis. Bladder is unremarkable. Stomach/Bowel: Stomach is within normal limits. Difficult visualization of the appendix. No definite right lower quadrant inflammatory process. No evidence of bowel wall thickening, distention, or inflammatory changes. Vascular/Lymphatic:  No significant vascular findings are present. No enlarged abdominal or pelvic lymph nodes. Reproductive: Prominent endometrial stripe. Intermediate density lesion left adnexal lesion corresponds to ultrasound mass recommended for follow-up. Probable rim enhancing cyst right ovary. Other: Moderate free fluid in the pelvis.  No free air. Musculoskeletal: No acute or significant osseous findings. IMPRESSION: 1. Moderate free fluid in the pelvis. Rim enhancing structure in the right pelvis is suspicious for an involuting ovarian cyst. This was not well seen on recent pelvic ultrasound. 2. Difficult visualization of the appendix however no right lower quadrant inflammatory changes to suggest appendicitis 3. Borderline enlarged spleen Electronically Signed   By: Jasmine Pang M.D.   On: 12/11/2016 16:38    Procedures Procedures (including critical care time)  Medications Ordered in ED Medications  sodium chloride 0.9 % bolus 1,000 mL (0 mLs Intravenous Stopped 12/11/16 1423)  ketorolac (TORADOL) 30 MG/ML injection 30 mg (30 mg Intravenous Given 12/11/16 1337)  iopamidol (ISOVUE-300) 61 % injection 100 mL (100 mLs Intravenous Contrast Given 12/11/16 1620)     Initial  Impression / Assessment and Plan / ED Course  I have reviewed the triage vital signs and the nursing notes.  Pertinent labs & imaging results that were available during my care of the patient were reviewed by me and considered in my medical decision making (see chart for details).      Patient presents with abdominal pain. Patient is nontoxic appearing, afebrile, not tachycardic, not tachypneic, not hypotensive, maintains SPO2 of 100% on room air, and is in no apparent distress. Patient has no signs of sepsis or other serious or life-threatening condition. CT and ultrasound results could reflect the aftermath of a rupturing ovarian cyst, however, no other pertinent abnormalities noted. Clue cells on wet prep. No abnormalities on urinalysis to explain patient's urinary symptoms. Full lab and imaging results were discussed with the patient.   Anemia was addressed with the patient. She states this is the normal level for her. She states is supposed to be taking iron supplements, but has not been because they make her constipated. She denies shortness breath, chest pain, dizziness, syncope, or any other related complaints. She is advised to follow-up with her PCP both for her current complaints and to have her labs retested. Return precautions discussed. Patient voices understanding of all instructions and is comfortable discharge.    Vitals:   12/11/16 1146 12/11/16 1355 12/11/16 1727  BP: 139/71 114/58 (!) 103/50  Pulse: 111 71 74  Resp: 16 16 16   Temp: 98.3 F (36.8 C)    TempSrc: Oral    SpO2: 100% 100% 100%  Weight: 57.6 kg    Height: 5\' 4"  (1.626 m)       Final Clinical Impressions(s) / ED Diagnoses   Final diagnoses:  Right lower quadrant abdominal pain  BV (bacterial vaginosis)    New Prescriptions Discharge Medication List as of 12/11/2016  5:20 PM    START taking these medications   Details  metroNIDAZOLE (FLAGYL) 500 MG tablet Take 1 tablet (500 mg total) by mouth 2 (two)  times daily., Starting Thu 12/11/2016, Print    naproxen (NAPROSYN) 500 MG tablet Take 1 tablet (500 mg total) by mouth 2 (two) times daily., Starting Thu 12/11/2016, Print         Anselm Pancoast, PA-C 12/12/16 1648    Lavera Guise, MD 12/13/16 [phone]

## 2016-12-12 LAB — RPR: RPR: NONREACTIVE

## 2016-12-12 LAB — HIV ANTIBODY (ROUTINE TESTING W REFLEX): HIV Screen 4th Generation wRfx: NONREACTIVE

## 2016-12-15 LAB — GC/CHLAMYDIA PROBE AMP (~~LOC~~) NOT AT ARMC
Chlamydia: NEGATIVE
Neisseria Gonorrhea: NEGATIVE

## 2017-01-19 ENCOUNTER — Encounter: Payer: BLUE CROSS/BLUE SHIELD | Admitting: Obstetrics & Gynecology

## 2021-04-02 ENCOUNTER — Encounter (HOSPITAL_BASED_OUTPATIENT_CLINIC_OR_DEPARTMENT_OTHER): Payer: Self-pay | Admitting: Obstetrics and Gynecology

## 2021-04-02 ENCOUNTER — Other Ambulatory Visit: Payer: Self-pay

## 2021-04-02 ENCOUNTER — Emergency Department (HOSPITAL_BASED_OUTPATIENT_CLINIC_OR_DEPARTMENT_OTHER): Payer: Medicaid Other

## 2021-04-02 ENCOUNTER — Emergency Department (HOSPITAL_BASED_OUTPATIENT_CLINIC_OR_DEPARTMENT_OTHER)
Admission: EM | Admit: 2021-04-02 | Discharge: 2021-04-02 | Disposition: A | Discharge status: Home / Self Care | Payer: Medicaid Other | Attending: Emergency Medicine | Admitting: Emergency Medicine

## 2021-04-02 DIAGNOSIS — N939 Abnormal uterine and vaginal bleeding, unspecified: Secondary | ICD-10-CM

## 2021-04-02 DIAGNOSIS — Z87891 Personal history of nicotine dependence: Secondary | ICD-10-CM | POA: Diagnosis not present

## 2021-04-02 DIAGNOSIS — E876 Hypokalemia: Secondary | ICD-10-CM | POA: Insufficient documentation

## 2021-04-02 DIAGNOSIS — O209 Hemorrhage in early pregnancy, unspecified: Secondary | ICD-10-CM | POA: Insufficient documentation

## 2021-04-02 DIAGNOSIS — R102 Pelvic and perineal pain: Secondary | ICD-10-CM | POA: Diagnosis not present

## 2021-04-02 DIAGNOSIS — Z3A01 Less than 8 weeks gestation of pregnancy: Secondary | ICD-10-CM | POA: Diagnosis not present

## 2021-04-02 LAB — URINALYSIS, ROUTINE W REFLEX MICROSCOPIC
Bilirubin Urine: NEGATIVE
Glucose, UA: NEGATIVE mg/dL
Ketones, ur: NEGATIVE mg/dL
Nitrite: NEGATIVE
Protein, ur: NEGATIVE mg/dL
Specific Gravity, Urine: 1.014 (ref 1.005–1.030)
pH: 6.5 (ref 5.0–8.0)

## 2021-04-02 LAB — BASIC METABOLIC PANEL
Anion gap: 10 (ref 5–15)
BUN: 9 mg/dL (ref 6–20)
CO2: 24 mmol/L (ref 22–32)
Calcium: 9.5 mg/dL (ref 8.9–10.3)
Chloride: 101 mmol/L (ref 98–111)
Creatinine, Ser: 0.53 mg/dL (ref 0.44–1.00)
GFR, Estimated: >60 mL/min (ref >60)
Glucose, Bld: 95 mg/dL (ref 70–99)
Potassium: 3.3 mmol/L — ABNORMAL LOW (ref 3.5–5.1)
Sodium: 135 mmol/L (ref 135–145)

## 2021-04-02 LAB — CBC WITH DIFFERENTIAL/PLATELET
Abs Immature Granulocytes: 0.02 10*3/uL (ref 0.00–0.07)
Basophils Absolute: 0 10*3/uL (ref 0.0–0.1)
Basophils Relative: 1 %
Eosinophils Absolute: 0.1 10*3/uL (ref 0.0–0.5)
Eosinophils Relative: 1 %
HCT: 34.2 % — ABNORMAL LOW (ref 36.0–46.0)
Hemoglobin: 9 g/dL — ABNORMAL LOW (ref 12.0–15.0)
Immature Granulocytes: 0 %
Lymphocytes Relative: 25 %
Lymphs Abs: 1.3 10*3/uL (ref 0.7–4.0)
MCH: 15.4 pg — ABNORMAL LOW (ref 26.0–34.0)
MCHC: 26.3 g/dL — ABNORMAL LOW (ref 30.0–36.0)
MCV: 58.6 fL — ABNORMAL LOW (ref 80.0–100.0)
Monocytes Absolute: 0.5 10*3/uL (ref 0.1–1.0)
Monocytes Relative: 9 %
Neutro Abs: 3.4 10*3/uL (ref 1.7–7.7)
Neutrophils Relative %: 64 %
Platelets: 231 10*3/uL (ref 150–400)
RBC: 5.84 MIL/uL — ABNORMAL HIGH (ref 3.87–5.11)
RDW: 23.6 % — ABNORMAL HIGH (ref 11.5–15.5)
WBC: 5.2 10*3/uL (ref 4.0–10.5)
nRBC: 0 % (ref 0.0–0.2)

## 2021-04-02 LAB — HCG, QUANTITATIVE, PREGNANCY: hCG, Beta Chain, Quant, S: 11860 m[IU]/mL — ABNORMAL HIGH (ref <5)

## 2021-04-02 LAB — PREGNANCY, URINE: Preg Test, Ur: POSITIVE — AB

## 2021-04-02 LAB — ABO/RH: ABO/RH(D): A POS

## 2021-04-02 NOTE — Discharge Instructions (Addendum)
Make an appointment to follow-up with OB/GYN.  Dr. Su Hilt information provided above.  Having your quantitative hCG rechecked after 48 hours will give an indication of the progression of the pregnancy.  Return for heavy vaginal bleeding or for any new or worse symptoms.

## 2021-04-02 NOTE — ED Provider Notes (Signed)
[facility] EMERGENCY DEPT Provider Note   CSN: [id number] Arrival date & time: 04/02/21  1422     History Chief Complaint  Patient presents with   Vaginal Bleeding    Sharon Khan is a 30 y.o. female.  Patient last menstrual period was May 25.  She has had several positive home pregnancy test.  She started having vaginal spotting today.  She has been having some lower abdominal cramping since May 25.  Patient would be gravida 2 para 0.  No passage of any clots.  No severe abdominal pain.  Not feeling lightheaded not feeling like she is in a pass out.  No dysuria no fever.      Past Medical History:  Diagnosis Date   Anemia    Vaginal Pap smear, abnormal     There are no problems to display for this patient.   Past Surgical History:  Procedure Laterality Date   BREAST LUMPECTOMY  2012     OB History     Gravida  2   Para      Term      Preterm      AB  1   Living  0      SAB  1   IAB  0   Ectopic  0   Multiple      Live Births  0           No family history on file.  Social History   Tobacco Use   Smoking status: Former    Packs/day: 1.00    Years: 4.00    Pack years: 4.00    Types: Cigarettes    Quit date: 03/06/2019    Years since quitting: 2.0    Passive exposure: Past   Smokeless tobacco: Never  Vaping Use   Vaping Use: Never used  Substance Use Topics   Alcohol use: Yes    Alcohol/week: 0.0 standard drinks    Comment: socially    Drug use: Not Currently    Frequency: 3.0 times per week    Types: Marijuana    Home Medications Prior to Admission medications   Medication Sig Start Date End Date Taking? Authorizing Provider  metroNIDAZOLE (FLAGYL) 500 MG tablet Take 1 tablet (500 mg total) by mouth 2 (two) times daily. Patient not taking: Reported on 04/02/2021 12/11/16   Joy, Ines Bloomer C, PA-C  naproxen (NAPROSYN) 500 MG tablet Take 1 tablet (500 mg total) by mouth 2 (two) times daily. Patient not taking: Reported  on 04/02/2021 12/11/16   Anselm Pancoast, PA-C    Allergies    Patient has no known allergies.  Review of Systems   Review of Systems  Constitutional:  Negative for chills and fever.  HENT:  Negative for ear pain and sore throat.   Eyes:  Negative for pain and visual disturbance.  Respiratory:  Negative for cough and shortness of breath.   Cardiovascular:  Negative for chest pain and palpitations.  Gastrointestinal:  Negative for abdominal pain and vomiting.  Genitourinary:  Positive for vaginal bleeding. Negative for dysuria and hematuria.  Musculoskeletal:  Negative for arthralgias and back pain.  Skin:  Negative for color change and rash.  Neurological:  Negative for seizures and syncope.  All other systems reviewed and are negative.  Physical Exam Updated Vital Signs BP 108/62 (BP Location: Right Arm)   Pulse 85   Temp 98.6 F (37 C)   Resp 17   Ht 1.651 m (5\' 5" )  Wt 59 kg   LMP 02/27/2021 (Exact Date)   SpO2 100%   BMI 21.63 kg/m   Physical Exam Vitals and nursing note reviewed.  Constitutional:      General: She is not in acute distress.    Appearance: She is well-developed. She is not ill-appearing.  HENT:     Head: Normocephalic and atraumatic.  Eyes:     Conjunctiva/sclera: Conjunctivae normal.  Cardiovascular:     Rate and Rhythm: Normal rate and regular rhythm.     Heart sounds: No murmur heard. Pulmonary:     Effort: Pulmonary effort is normal. No respiratory distress.     Breath sounds: Normal breath sounds.  Abdominal:     General: There is no distension.     Palpations: Abdomen is soft.     Tenderness: There is no abdominal tenderness. There is no guarding.  Musculoskeletal:        General: No swelling.     Cervical back: Neck supple.  Skin:    General: Skin is warm and dry.     Capillary Refill: Capillary refill takes less than 2 seconds.  Neurological:     General: No focal deficit present.     Mental Status: She is alert and oriented to  person, place, and time.    ED Results / Procedures / Treatments   Labs (all labs ordered are listed, but only abnormal results are displayed) Labs Reviewed  URINALYSIS, ROUTINE W REFLEX MICROSCOPIC - Abnormal; Notable for the following components:      Result Value   Hgb urine dipstick TRACE (*)    Leukocytes,Ua TRACE (*)    All other components within normal limits  PREGNANCY, URINE - Abnormal; Notable for the following components:   Preg Test, Ur POSITIVE (*)    All other components within normal limits  CBC WITH DIFFERENTIAL/PLATELET - Abnormal; Notable for the following components:   RBC 5.84 (*)    Hemoglobin 9.0 (*)    HCT 34.2 (*)    MCV 58.6 (*)    MCH 15.4 (*)    MCHC 26.3 (*)    RDW 23.6 (*)    All other components within normal limits  BASIC METABOLIC PANEL - Abnormal; Notable for the following components:   Potassium 3.3 (*)    All other components within normal limits  HCG, QUANTITATIVE, PREGNANCY - Abnormal; Notable for the following components:   hCG, Beta Chain, Quant, S 11,860 (*)    All other components within normal limits  ABO/RH    EKG None  Radiology US OB LESS THAN 14 WEEKS WITH OB TRANSVAGINAL  Result Date: 04/02/2021 CLINICAL DATA:  Vaginal bleeding EXAM: OBSTETRIC <14 WK [place] AND TRANSVAGINAL OB US TECHNIQUE: Both transabdominal and transvaginal ultrasound examinations were performed for complete evaluation of the gestation as well as the maternal uterus, adnexal regions, and pelvic cul-de-sac. Transvaginal technique was performed to assess early pregnancy. COMPARISON:  None. FINDINGS: Intrauterine gestational sac: Single IUP Yolk sac:  Visualized Embryo:  Probably visualized Cardiac Activity: Not visualized CRL: 2.2 mm   5 w   5 d                  [place] EDC: 11/28/2021 Subchorionic hemorrhage:  None visualized. Maternal uterus/adnexae: Ovaries are within normal limits. Right ovary measures 4.6 x 2.6 x 2.8 cm and contains a corpus luteum. The left ovary  measures 2.8 x 1.8 x 2.6 cm. Small free fluid IMPRESSION: 1. Single intrauterine pregnancy with visible gestational sac,  yolk sac and probable tiny embryo. Unable to visualize cardiac activity, but this may be related to extremely small size of embryo. Suggest sonographic follow-up in 10-14 days to reassess viability. 2. Small free fluid Electronically Signed   By: Jasmine Pang M.D.   On: 04/02/2021 18:29    Procedures Procedures   Medications Ordered in ED Medications - No data to display  ED Course  I have reviewed the triage vital signs and the nursing notes.  Pertinent labs & imaging results that were available during my care of the patient were reviewed by me and considered in my medical decision making (see chart for details).    MDM Rules/Calculators/A&P                         Was able to find patient's Rh factor from visit in [place] and January 2019.  It states clearly that she is a positive.  We did type and Rh here but results are still pending.  Pregnancy test positive urinalysis negative for urinary tract infection.  Transvaginal ultrasound consistent with intrauterine pregnancy early no fetal heartbeat at this time.  Basic metabolic panel normal other than some mild hypokalemia with a potassium of 3.3.  Patient's quantitative hCG 11,860.  No leukocytosis.  Patient's hemoglobin is low at 9.0.  However in the past that has been lower.  Patient given referral to GYN locally.  Understands that she can have her quantitative hCG rechecked in 48 hours which will give some indication about progression of the pregnancy.  No evidence of or concern for ectopic pregnancy at this time.  No evidence of any blood in the pelvis as part of consideration with hemoglobin being low.  Although a twin ectopic not completely ruled out.  Final Clinical Impression(s) / ED Diagnoses Final diagnoses:  Vaginal bleeding before [redacted] weeks gestation    Rx / DC Orders ED Discharge Orders     None         Vanetta Mulders, MD 04/02/21 2021

## 2021-04-02 NOTE — ED Notes (Signed)
This RN presented the AVS utilizing Teachback Method. Patient verbalizes understanding of Discharge Instructions. Opportunity for Questioning and Answers were provided. Patient Discharged from ED ambulatory to Home with Significant Other.   

## 2021-04-02 NOTE — ED Triage Notes (Signed)
Patient reports LMP May 25th and reports she is pregnant. Patient states she is having vaginal spotting.

## 2021-04-02 NOTE — ED Notes (Signed)
MD at Bedside.

## 2021-04-02 NOTE — ED Notes (Signed)
Patient states if any personal matters need to be discussed to please ask her guest, no matter who it is, to step out of the room as she does not want her medical hx disclosed to anyone.

## 2021-04-02 NOTE — ED Notes (Signed)
Pt given Ginger Ale, per Dr. Deretha Emory

## 2021-06-03 LAB — OB RESULTS CONSOLE RUBELLA ANTIBODY, IGM: Rubella: IMMUNE

## 2021-06-03 LAB — OB RESULTS CONSOLE RPR: RPR: NONREACTIVE

## 2021-06-03 LAB — OB RESULTS CONSOLE HEPATITIS B SURFACE ANTIGEN: Hepatitis B Surface Ag: NEGATIVE

## 2021-06-04 LAB — OB RESULTS CONSOLE GC/CHLAMYDIA
Chlamydia: NEGATIVE
Gonorrhea: NEGATIVE

## 2021-09-05 LAB — OB RESULTS CONSOLE HIV ANTIBODY (ROUTINE TESTING): HIV: NONREACTIVE

## 2021-10-06 NOTE — L&D Delivery Note (Signed)
Delivery Note Labor onset:   Labor Onset Time: 1041 Complete dilation at 9:06 PM  Onset of pushing at 2106 FHR second stage Cat 2 Analgesia/Anesthesia intrapartum: epidural  Guided pushing with maternal urge. Delivery of a viable female at 2215. Fetal head delivered in LOA position.  Nuchal cord: x1 somersaulted .  Infant placed on maternal abd, dried, and tactile stim. Spontaneous cry.  Cord double clamped after pulsation ceased and cut by FOB.  3 Rns present for birth.  Cord blood sample collected: yes Arterial cord blood sample collected: N/A  AMTSL Placenta delivered Tomasa Blase via Binnie Kand Maneuver, intact, with 3 VC.  Placenta to L&D. Uterine tone firm, bleeding moderate  1st degree laceration identified.  Anesthesia: epidural Repair: 3-0 vicryl QBL/EBL (mL): 250 Complications: N/A APGAR: APGAR (1 MIN): 8   APGAR (5 MINS): 9   APGAR (10 MINS):   Mom to postpartum.  Baby to Couplet care / Skin to Skin.  Gerhard Munch Jermisha Hoffart MSN, CNM 11/26/2021, 10:56 PM

## 2021-10-30 LAB — OB RESULTS CONSOLE GBS: GBS: NEGATIVE

## 2021-11-25 ENCOUNTER — Encounter (HOSPITAL_COMMUNITY): Payer: Self-pay | Admitting: Obstetrics & Gynecology

## 2021-11-25 ENCOUNTER — Inpatient Hospital Stay (HOSPITAL_COMMUNITY)
Admission: AD | Admit: 2021-11-25 | Discharge: 2021-11-28 | DRG: 806 | Disposition: A | Discharge status: Home / Self Care | Payer: Medicaid Other | Attending: Obstetrics & Gynecology | Admitting: Obstetrics & Gynecology

## 2021-11-25 ENCOUNTER — Other Ambulatory Visit: Payer: Self-pay

## 2021-11-25 DIAGNOSIS — Z20822 Contact with and (suspected) exposure to covid-19: Secondary | ICD-10-CM | POA: Diagnosis present

## 2021-11-25 DIAGNOSIS — O9912 Other diseases of the blood and blood-forming organs and certain disorders involving the immune mechanism complicating childbirth: Secondary | ICD-10-CM | POA: Diagnosis present

## 2021-11-25 DIAGNOSIS — D6959 Other secondary thrombocytopenia: Secondary | ICD-10-CM | POA: Diagnosis present

## 2021-11-25 DIAGNOSIS — O133 Gestational [pregnancy-induced] hypertension without significant proteinuria, third trimester: Secondary | ICD-10-CM

## 2021-11-25 DIAGNOSIS — O1493 Unspecified pre-eclampsia, third trimester: Secondary | ICD-10-CM | POA: Diagnosis not present

## 2021-11-25 DIAGNOSIS — D509 Iron deficiency anemia, unspecified: Secondary | ICD-10-CM | POA: Diagnosis present

## 2021-11-25 DIAGNOSIS — O1400 Mild to moderate pre-eclampsia, unspecified trimester: Principal | ICD-10-CM | POA: Diagnosis present

## 2021-11-25 DIAGNOSIS — O99119 Other diseases of the blood and blood-forming organs and certain disorders involving the immune mechanism complicating pregnancy, unspecified trimester: Secondary | ICD-10-CM | POA: Diagnosis present

## 2021-11-25 DIAGNOSIS — Z3A39 39 weeks gestation of pregnancy: Secondary | ICD-10-CM

## 2021-11-25 DIAGNOSIS — Z87891 Personal history of nicotine dependence: Secondary | ICD-10-CM

## 2021-11-25 DIAGNOSIS — Z0371 Encounter for suspected problem with amniotic cavity and membrane ruled out: Secondary | ICD-10-CM

## 2021-11-25 DIAGNOSIS — O9902 Anemia complicating childbirth: Secondary | ICD-10-CM | POA: Diagnosis present

## 2021-11-25 DIAGNOSIS — D696 Thrombocytopenia, unspecified: Secondary | ICD-10-CM | POA: Diagnosis present

## 2021-11-25 DIAGNOSIS — O139 Gestational [pregnancy-induced] hypertension without significant proteinuria, unspecified trimester: Secondary | ICD-10-CM

## 2021-11-25 LAB — POCT FERN TEST: POCT Fern Test: NEGATIVE

## 2021-11-25 LAB — CBC
HCT: 41.8 % (ref 36.0–46.0)
Hemoglobin: 13.6 g/dL (ref 12.0–15.0)
MCH: 27.3 pg (ref 26.0–34.0)
MCHC: 32.5 g/dL (ref 30.0–36.0)
MCV: 83.9 fL (ref 80.0–100.0)
Platelets: 111 10*3/uL — ABNORMAL LOW (ref 150–400)
RBC: 4.98 MIL/uL (ref 3.87–5.11)
RDW: 13.2 % (ref 11.5–15.5)
WBC: 7.4 10*3/uL (ref 4.0–10.5)
nRBC: 0 % (ref 0.0–0.2)

## 2021-11-25 LAB — COMPREHENSIVE METABOLIC PANEL
ALT: 29 U/L (ref 0–44)
AST: 34 U/L (ref 15–41)
Albumin: 3 g/dL — ABNORMAL LOW (ref 3.5–5.0)
Alkaline Phosphatase: 171 U/L — ABNORMAL HIGH (ref 38–126)
Anion gap: 10 (ref 5–15)
BUN: 8 mg/dL (ref 6–20)
CO2: 24 mmol/L (ref 22–32)
Calcium: 9.1 mg/dL (ref 8.9–10.3)
Chloride: 103 mmol/L (ref 98–111)
Creatinine, Ser: 0.65 mg/dL (ref 0.44–1.00)
GFR, Estimated: >60 mL/min (ref >60)
Glucose, Bld: 104 mg/dL — ABNORMAL HIGH (ref 70–99)
Potassium: 4.1 mmol/L (ref 3.5–5.1)
Sodium: 137 mmol/L (ref 135–145)
Total Bilirubin: 0.4 mg/dL (ref 0.3–1.2)
Total Protein: 6 g/dL — ABNORMAL LOW (ref 6.5–8.1)

## 2021-11-25 LAB — PROTEIN / CREATININE RATIO, URINE
Creatinine, Urine: 101.74 mg/dL
Protein Creatinine Ratio: 1.22 mg/mg{Cre} — ABNORMAL HIGH (ref 0.00–0.15)
Total Protein, Urine: 124 mg/dL

## 2021-11-25 LAB — TYPE AND SCREEN
ABO/RH(D): A POS
Antibody Screen: NEGATIVE

## 2021-11-25 LAB — RESP PANEL BY RT-PCR (FLU A&B, COVID) ARPGX2
Influenza A by PCR: NEGATIVE
Influenza B by PCR: NEGATIVE
SARS Coronavirus 2 by RT PCR: NEGATIVE

## 2021-11-25 MED ORDER — EPHEDRINE 5 MG/ML INJ: 10.0000 mg | INTRAVENOUS | Status: DC | PRN

## 2021-11-25 MED ORDER — ACETAMINOPHEN 325 MG PO TABS: 650.0000 mg | ORAL_TABLET | ORAL | Status: DC | PRN

## 2021-11-25 MED ORDER — OXYTOCIN-SODIUM CHLORIDE 30-0.9 UT/500ML-% IV SOLN
2.5000 [IU]/h | INTRAVENOUS | Status: DC
  Administered 2021-11-26: 2.5 [IU]/h via INTRAVENOUS
  Filled 2021-11-25: qty 500

## 2021-11-25 MED ORDER — LACTATED RINGERS IV SOLN: 500.0000 mL | Freq: Once | INTRAVENOUS | Status: DC

## 2021-11-25 MED ORDER — FENTANYL CITRATE (PF) 100 MCG/2ML IJ SOLN
50.0000 ug | INTRAMUSCULAR | Status: DC | PRN
  Administered 2021-11-26 (×2): 50 ug via INTRAVENOUS
  Administered 2021-11-26 (×4): 100 ug via INTRAVENOUS
  Filled 2021-11-25 (×5): qty 2

## 2021-11-25 MED ORDER — PHENYLEPHRINE 40 MCG/ML (10ML) SYRINGE FOR IV PUSH (FOR BLOOD PRESSURE SUPPORT): 80.0000 ug | PREFILLED_SYRINGE | INTRAVENOUS | Status: DC | PRN

## 2021-11-25 MED ORDER — OXYTOCIN BOLUS FROM INFUSION
333.0000 mL | Freq: Once | INTRAVENOUS | Status: AC
  Administered 2021-11-26: 333 mL via INTRAVENOUS

## 2021-11-25 MED ORDER — ONDANSETRON HCL 4 MG/2ML IJ SOLN
4.0000 mg | Freq: Four times a day (QID) | INTRAMUSCULAR | Status: DC | PRN
  Administered 2021-11-25: 4 mg via INTRAVENOUS
  Filled 2021-11-25: qty 2

## 2021-11-25 MED ORDER — LIDOCAINE HCL (PF) 1 % IJ SOLN: 30.0000 mL | INTRAMUSCULAR | Status: DC | PRN

## 2021-11-25 MED ORDER — FENTANYL-BUPIVACAINE-NACL 0.5-0.125-0.9 MG/250ML-% EP SOLN
12.0000 mL/h | EPIDURAL | Status: DC | PRN
  Administered 2021-11-26: 12 mL/h via EPIDURAL
  Filled 2021-11-25: qty 250

## 2021-11-25 MED ORDER — SOD CITRATE-CITRIC ACID 500-334 MG/5ML PO SOLN
30.0000 mL | ORAL | Status: DC | PRN
  Administered 2021-11-25: 30 mL via ORAL
  Filled 2021-11-25: qty 30

## 2021-11-25 MED ORDER — OXYCODONE-ACETAMINOPHEN 5-325 MG PO TABS: 1.0000 | ORAL_TABLET | ORAL | Status: DC | PRN

## 2021-11-25 MED ORDER — DIPHENHYDRAMINE HCL 50 MG/ML IJ SOLN: 12.5000 mg | INTRAMUSCULAR | Status: DC | PRN

## 2021-11-25 MED ORDER — OXYCODONE-ACETAMINOPHEN 5-325 MG PO TABS: 2.0000 | ORAL_TABLET | ORAL | Status: DC | PRN

## 2021-11-25 MED ORDER — MISOPROSTOL 25 MCG QUARTER TABLET
25.0000 ug | ORAL_TABLET | ORAL | Status: DC | PRN
  Administered 2021-11-25 – 2021-11-26 (×3): 25 ug via VAGINAL
  Filled 2021-11-25 (×4): qty 1

## 2021-11-25 MED ORDER — LACTATED RINGERS IV SOLN: 500.0000 mL | INTRAVENOUS | Status: DC | PRN

## 2021-11-25 MED ORDER — TERBUTALINE SULFATE 1 MG/ML IJ SOLN: 0.2500 mg | Freq: Once | INTRAMUSCULAR | Status: DC | PRN

## 2021-11-25 MED ORDER — LACTATED RINGERS IV SOLN: INTRAVENOUS | Status: DC

## 2021-11-25 NOTE — H&P (Signed)
OB ADMISSION/ HISTORY & PHYSICAL:  Admission Date: 11/25/2021  7:01 PM  Admit Diagnosis: Gestational HTN, IOL  Sharon Khan is a 31 y.o. female G2P0010 [redacted]w[redacted]d presenting for leaking of fluid. R/O rupture of membranes in MAU. New diagnosis of GHTN this admission. Awaiting labs. Endorses active FM. Occasional ctx. GHTN vs Preeclampsia reviewed with pt. POC discussed. Questions answered. Verbal consent for cytotec.   History of current pregnancy: G2P0010   Primary OB Provider: [facility] Patient entered care with [facility] at 13.1 wks.   EDC 11/29/21 by 13 wk US Anatomy scan:  19.6 wks, complete w/ posterior placenta.   EFW @ 33.5 wks 5lbs 3 oz, 57%  Significant prenatal events:  Iron deficiency anemia Benign Gestational thrombocytopenia - NOB plt 149, 28wks 132   Prenatal Labs: ABO, Rh: --/--/A POS (06/28 1532) Antibody:  Negative Rubella:   immune RPR:   NR HBsAg:   NR HIV:   NR GTT: 118 GBS: Negative/-- (01/25 0000)   GC/CHL: Negative/Negative Genetics: Low risk   OB History  Gravida Para Term Preterm AB Living  2       1 0  SAB IAB Ectopic Multiple Live Births  1 0 0   0    # Outcome Date GA Lbr Len/2nd Weight Sex Delivery Anes PTL Lv  2 Current           1 SAB             Medical / Surgical History: Past medical history:  Past Medical History:  Diagnosis Date   Anemia    Vaginal Pap smear, abnormal     Past surgical history:  Past Surgical History:  Procedure Laterality Date   BREAST LUMPECTOMY  2012   Family History: History reviewed. No pertinent family history.  Social History:  reports that she quit smoking about 2 years ago. Her smoking use included cigarettes. She has a 4.00 pack-year smoking history. She has been exposed to tobacco smoke. She has never used smokeless tobacco. She reports that she does not currently use alcohol. She reports that she does not currently use drugs after having used the following drugs: Marijuana. Frequency: 3.00 times per  week.  Allergies: Patient has no known allergies.   Current Medications at time of admission:  Prior to Admission medications   Medication Sig Start Date End Date Taking? Authorizing Provider  ferrous sulfate 325 (65 FE) MG tablet Take 325 mg by mouth daily with breakfast.   Yes [provider]  Prenatal Vit-Fe Fumarate-FA (PRENATAL MULTIVITAMIN) TABS tablet Take 1 tablet by mouth daily at 12 noon.   Yes [provider]  metroNIDAZOLE (FLAGYL) 500 MG tablet Take 1 tablet (500 mg total) by mouth 2 (two) times daily. Patient not taking: Reported on 04/02/2021 12/11/16   Joy, Ines Bloomer C, PA-C  naproxen (NAPROSYN) 500 MG tablet Take 1 tablet (500 mg total) by mouth 2 (two) times daily. Patient not taking: Reported on 04/02/2021 12/11/16   Anselm Pancoast, PA-C    Review of Systems: Constitutional: Negative   HENT: Negative   Eyes: Negative   Respiratory: Negative   Cardiovascular: negative Gastrointestinal: Negative  Genitourinary: negative for bloody show, negative for LOF   Musculoskeletal: positive pedal edema Skin: Negative   Neurological: Negative   Endo/Heme/Allergies: Negative   Psychiatric/Behavioral: Negative    Physical Exam: VS: Blood pressure (!) 157/90, pulse 71, temperature (!) 97.5 F (36.4 C), temperature source Oral, resp. rate 18, height 5\' 5"  (1.651 m), weight 89.9 kg, last  menstrual period 02/27/2021, SpO2 99 %. Constitutional:      General: She is not in acute distress.    Appearance: Normal appearance.  HENT:     Head: Normocephalic and atraumatic.  Eyes:     General: No scleral icterus.    Conjunctiva/sclera: Conjunctivae normal.  Pulmonary:     Effort: Pulmonary effort is normal. No respiratory distress.  Abdominal:     Palpations: Abdomen is soft.     Tenderness: There is no abdominal tenderness.  Genitourinary: per MAU    Comments: Pelvic: NEFG, small amount of thick white discharge. No pooling of fluid. No blood.  Musculoskeletal:      Right lower leg: 3+ Pitting Edema present.     Left lower leg: 3+ Pitting Edema present.  Neurological:     General: No focal deficit present.     Mental Status: She is alert.     Deep Tendon Reflexes:     Reflex Scores:      Patellar reflexes are 2+ on the right side and 2+ on the left side.    Comments: No clonus  Psychiatric:        Mood and Affect: Mood normal.        Behavior: Behavior normal.      Cervical Exam : 1/50/-2     Bedside Ultrasound per MAU Pt informed that the ultrasound is considered a limited OB ultrasound and is not intended to be a complete ultrasound exam.  Patient also informed that the ultrasound is not being completed with the intent of assessing for fetal or placental anomalies or any pelvic abnormalities.  Explained that the purpose of todays ultrasound is to assess for  presentation.  Patient acknowledges the purpose of the exam and the limitations of the study.    Cephalic on U/S   FHT Baseline 145, moderate variability, accels present, no decels Toco: occasional Cat: 1       Prenatal Transfer Tool  Maternal Diabetes: No Genetic Screening: Normal Maternal Ultrasounds/Referrals: Normal Fetal Ultrasounds or other Referrals:  None Maternal Substance Abuse:  No Significant Maternal Medications:  None Significant Maternal Lab Results: Group B Strep negative    Assessment: 31 y.o. G2P0010 [redacted]w[redacted]d presented to MAU for R/O PROM. Negative pooling and negative fern. Admitted for IOL d/t new diagnosis of GHTN Denies headache, visual disturbances or epigastric pain Preeclampsia labs pending U/S in MAU - vertex  FHR category 1 GBS negative Pain management plan: epidural, IV pain meds prn   Plan:  Admit to L&D Routine admission orders Induction: cytotec per vaginal Preeclampsia labs pending GBS negative Dr Sallye Ober notified of admission and plan of care  Carollee Leitz MSN, CNM 11/25/2021 8:28 PM

## 2021-11-25 NOTE — MAU Provider Note (Signed)
History     [id number]  Arrival date and time: 11/25/21 1901    Chief Complaint  Patient presents with   Rupture of Membranes     HPI Sharon Khan is a 31 y.o. at [redacted]w[redacted]d who presents for vaginal discharge. Patient felt a gush of fluid this evening & thought her water had broken. Leaking has not continued. Has been feeling irregular contractions for the last few days; every 10-15 minutes. Denies vaginal bleeding.  Denies history of hypertension, headache, visual disturbance, or epigastric pain. Reports good fetal movement. Has noticed a significant increase in lower leg swelling this week.    Vaginal bleeding: No LOF: Yes Fetal Movement: Yes Contractions: Yes   OB History     Gravida  2   Para      Term      Preterm      AB  1   Living  0      SAB  1   IAB  0   Ectopic  0   Multiple      Live Births  0           Past Medical History:  Diagnosis Date   Anemia    Vaginal Pap smear, abnormal     Past Surgical History:  Procedure Laterality Date   BREAST LUMPECTOMY  2012    History reviewed. No pertinent family history.  No Known Allergies  No current facility-administered medications on file prior to encounter.   Current Outpatient Medications on File Prior to Encounter  Medication Sig Dispense Refill   ferrous sulfate 325 (65 FE) MG tablet Take 325 mg by mouth daily with breakfast.     Prenatal Vit-Fe Fumarate-FA (PRENATAL MULTIVITAMIN) TABS tablet Take 1 tablet by mouth daily at 12 noon.     metroNIDAZOLE (FLAGYL) 500 MG tablet Take 1 tablet (500 mg total) by mouth 2 (two) times daily. (Patient not taking: Reported on 04/02/2021) 14 tablet 0   naproxen (NAPROSYN) 500 MG tablet Take 1 tablet (500 mg total) by mouth 2 (two) times daily. (Patient not taking: Reported on 04/02/2021) 30 tablet 0     ROS Pertinent positives and negative per HPI, all others reviewed and negative  Physical Exam   BP (!) 157/90    Pulse 71    Temp (!) 97.5 F  (36.4 C) (Oral)    Resp 18    Ht 5\' 5"  (1.651 m)    Wt 89.9 kg    LMP 02/27/2021 (Exact Date)    SpO2 99%    BMI 32.97 kg/m   Patient Vitals for the past 24 hrs:  BP Temp Temp src Pulse Resp SpO2 Height Weight  11/25/21 2015 (!) 157/90 -- -- 71 -- 99 % -- --  11/25/21 2000 (!) 145/88 -- -- 75 -- 99 % -- --  11/25/21 1951 (!) 146/93 -- -- 79 -- 99 % -- --  11/25/21 1933 (!) 158/88 (!) 97.5 F (36.4 C) Oral 71 18 -- 5\' 5"  (1.651 m) 89.9 kg    Physical Exam Vitals and nursing note reviewed. Exam conducted with a chaperone present.  Constitutional:      General: She is not in acute distress.    Appearance: Normal appearance.  HENT:     Head: Normocephalic and atraumatic.  Eyes:     General: No scleral icterus.    Conjunctiva/sclera: Conjunctivae normal.  Pulmonary:     Effort: Pulmonary effort is normal. No respiratory distress.  Abdominal:  Palpations: Abdomen is soft.     Tenderness: There is no abdominal tenderness.  Genitourinary:    Comments: Pelvic: NEFG, small amount of thick white discharge. No pooling of fluid. No blood.  Musculoskeletal:     Right lower leg: 3+ Pitting Edema present.     Left lower leg: 3+ Pitting Edema present.  Neurological:     General: No focal deficit present.     Mental Status: She is alert.     Deep Tendon Reflexes:     Reflex Scores:      Patellar reflexes are 2+ on the right side and 2+ on the left side.    Comments: No clonus  Psychiatric:        Mood and Affect: Mood normal.        Behavior: Behavior normal.     Cervical Exam  deferred  Bedside Ultrasound Pt informed that the ultrasound is considered a limited OB ultrasound and is not intended to be a complete ultrasound exam.  Patient also informed that the ultrasound is not being completed with the intent of assessing for fetal or placental anomalies or any pelvic abnormalities.  Explained that the purpose of todays ultrasound is to assess for  presentation.  Patient  acknowledges the purpose of the exam and the limitations of the study.     My interpretation: cephalic  FHT Baseline [id number], min to mod variability, 10x10 accels, no decels Toco: x1 Cat: 1  Labs Results for orders placed or performed during the hospital encounter of 11/25/21 (from the past 24 hour(s))  POCT fern test     Status: Normal   Collection Time: 11/25/21  8:33 PM  Result Value Ref Range   POCT Fern Test Negative = intact amniotic membranes     Imaging No results found.  MAU Course  Procedures Lab Orders         OB RESULT CONSOLE Group B Strep         Resp Panel by RT-PCR (Flu A&B, Covid) Nasopharyngeal Swab         CBC         Comprehensive metabolic panel         Protein / creatinine ratio, urine         RPR         POCT fern test    No orders of the defined types were placed in this encounter.  Imaging Orders  No imaging studies ordered today    MDM [facility] prenatal records reviewed under media tab. She is GBS negative. Has been normotensive during prenatal care.   Sterile speculum exam performed to evaluate complaint of leaking. No pooling of fluid. Fern slide collected & independently evaluated & resulted by me - negative.   Patient with persistently elevated BPs in MAU. Reviewed with Dr. Alesia Richards. Will admit for gestational hypertension.  Assessment and Plan   1. Gestational hypertension, third trimester  -preeclampsia labs ordered -persistently elevated BPs at 39+ weeks. Per [facility] patient will be admitted. [facility] CNM (Holshouser) notified & will admit -BSUS performed for presentation - vertex  2. Encounter for suspected PROM, with rupture of membranes not found  -no pooling on exam & fern negative  3. [redacted] weeks gestation of pregnancy      Jorje Guild, NP 11/25/21 8:33 PM

## 2021-11-25 NOTE — MAU Note (Signed)
PT SAYS LEAKING FLUID AT 6PM- WHILE EATING  PNC WITH [facility]- EMILY , CNM  VE- CLOSED LAST WEEK  DENIES HSV GBS- NEG FEELS UC -

## 2021-11-26 ENCOUNTER — Inpatient Hospital Stay (HOSPITAL_COMMUNITY): Payer: Medicaid Other | Admitting: Anesthesiology

## 2021-11-26 DIAGNOSIS — O1493 Unspecified pre-eclampsia, third trimester: Secondary | ICD-10-CM | POA: Diagnosis not present

## 2021-11-26 LAB — CBC
HCT: 40.3 % (ref 36.0–46.0)
Hemoglobin: 13 g/dL (ref 12.0–15.0)
MCH: 27 pg (ref 26.0–34.0)
MCHC: 32.3 g/dL (ref 30.0–36.0)
MCV: 83.6 fL (ref 80.0–100.0)
Platelets: 94 10*3/uL — ABNORMAL LOW (ref 150–400)
RBC: 4.82 MIL/uL (ref 3.87–5.11)
RDW: 13.1 % (ref 11.5–15.5)
WBC: 8.6 10*3/uL (ref 4.0–10.5)
nRBC: 0 % (ref 0.0–0.2)

## 2021-11-26 LAB — CBC WITH DIFFERENTIAL/PLATELET
Abs Immature Granulocytes: 0.03 10*3/uL (ref 0.00–0.07)
Basophils Absolute: 0 10*3/uL (ref 0.0–0.1)
Basophils Relative: 0 %
Eosinophils Absolute: 0 10*3/uL (ref 0.0–0.5)
Eosinophils Relative: 0 %
HCT: 35.5 % — ABNORMAL LOW (ref 36.0–46.0)
Hemoglobin: 11.4 g/dL — ABNORMAL LOW (ref 12.0–15.0)
Immature Granulocytes: 0 %
Lymphocytes Relative: 6 %
Lymphs Abs: 0.7 10*3/uL (ref 0.7–4.0)
MCH: 27 pg (ref 26.0–34.0)
MCHC: 32.1 g/dL (ref 30.0–36.0)
MCV: 84.1 fL (ref 80.0–100.0)
Monocytes Absolute: 0.5 10*3/uL (ref 0.1–1.0)
Monocytes Relative: 5 %
Neutro Abs: 10.1 10*3/uL — ABNORMAL HIGH (ref 1.7–7.7)
Neutrophils Relative %: 89 %
Platelets: 93 10*3/uL — ABNORMAL LOW (ref 150–400)
RBC: 4.22 MIL/uL (ref 3.87–5.11)
RDW: 13.2 % (ref 11.5–15.5)
WBC: 11.3 10*3/uL — ABNORMAL HIGH (ref 4.0–10.5)
nRBC: 0 % (ref 0.0–0.2)

## 2021-11-26 LAB — RPR: RPR Ser Ql: NONREACTIVE

## 2021-11-26 MED ORDER — HYDRALAZINE HCL 20 MG/ML IJ SOLN: 10.0000 mg | INTRAMUSCULAR | Status: DC | PRN

## 2021-11-26 MED ORDER — LABETALOL HCL 5 MG/ML IV SOLN: 40.0000 mg | INTRAVENOUS | Status: DC | PRN

## 2021-11-26 MED ORDER — LACTATED RINGERS IV SOLN: INTRAVENOUS | Status: DC

## 2021-11-26 MED ORDER — MISOPROSTOL 25 MCG QUARTER TABLET
25.0000 ug | ORAL_TABLET | ORAL | Status: DC
  Administered 2021-11-26: 25 ug via ORAL

## 2021-11-26 MED ORDER — LABETALOL HCL 5 MG/ML IV SOLN: 80.0000 mg | INTRAVENOUS | Status: DC | PRN

## 2021-11-26 MED ORDER — MAGNESIUM SULFATE 40 GM/1000ML IV SOLN
2.0000 g/h | INTRAVENOUS | Status: AC
  Filled 2021-11-26: qty 1000

## 2021-11-26 MED ORDER — OXYTOCIN-SODIUM CHLORIDE 30-0.9 UT/500ML-% IV SOLN
1.0000 m[IU]/min | INTRAVENOUS | Status: DC
  Administered 2021-11-26: 2 m[IU]/min via INTRAVENOUS

## 2021-11-26 MED ORDER — MAGNESIUM SULFATE BOLUS VIA INFUSION
4.0000 g | Freq: Once | INTRAVENOUS | Status: AC
  Administered 2021-11-27: 4 g via INTRAVENOUS
  Filled 2021-11-26: qty 1000

## 2021-11-26 MED ORDER — LIDOCAINE-EPINEPHRINE (PF) 2 %-1:200000 IJ SOLN
INTRAMUSCULAR | Status: DC | PRN
  Administered 2021-11-26: 5 mL via EPIDURAL

## 2021-11-26 MED ORDER — TERBUTALINE SULFATE 1 MG/ML IJ SOLN: 0.2500 mg | Freq: Once | INTRAMUSCULAR | Status: DC | PRN

## 2021-11-26 MED ORDER — LABETALOL HCL 5 MG/ML IV SOLN: 20.0000 mg | INTRAVENOUS | Status: DC | PRN

## 2021-11-26 NOTE — Anesthesia Preprocedure Evaluation (Signed)
Anesthesia Evaluation  Patient identified by MRN, date of birth, ID band Patient awake    Reviewed: Allergy & Precautions, NPO status , Patient's Chart, lab work & pertinent test results  Airway Mallampati: II  TM Distance: >3 FB Neck ROM: Full    Dental no notable dental hx.    Pulmonary neg pulmonary ROS, former smoker,    Pulmonary exam normal breath sounds clear to auscultation       Cardiovascular hypertension (preE), Normal cardiovascular exam Rhythm:Regular Rate:Normal     Neuro/Psych negative neurological ROS  negative psych ROS   GI/Hepatic negative GI ROS, Neg liver ROS,   Endo/Other  negative endocrine ROS  Renal/GU negative Renal ROS  negative genitourinary   Musculoskeletal negative musculoskeletal ROS (+)   Abdominal   Peds  Hematology  (+) Blood dyscrasia (gestational thrombocytopenia), ,   Anesthesia Other Findings IOL for preE without severe features  Reproductive/Obstetrics (+) Pregnancy                             Anesthesia Physical Anesthesia Plan  ASA: 3  Anesthesia Plan: Epidural   Post-op Pain Management:    Induction:   PONV Risk Score and Plan: Treatment may vary due to age or medical condition  Airway Management Planned: Natural Airway  Additional Equipment:   Intra-op Plan:   Post-operative Plan:   Informed Consent: I have reviewed the patients History and Physical, chart, labs and discussed the procedure including the risks, benefits and alternatives for the proposed anesthesia with the patient or authorized representative who has indicated his/her understanding and acceptance.       Plan Discussed with: Anesthesiologist  Anesthesia Plan Comments: (Patient identified. Risks, benefits, options discussed with patient including but not limited to bleeding, infection, nerve damage, paralysis, failed block, incomplete pain control, headache, blood  pressure changes, nausea, vomiting, reactions to medication, itching, and post partum back pain. Confirmed with bedside nurse the patient's most recent platelet count. Confirmed with the patient that they are not taking any anticoagulation, have any bleeding history or any family history of bleeding disorders. Patient expressed understanding and wishes to proceed. All questions were answered. )        Anesthesia Quick Evaluation

## 2021-11-26 NOTE — Progress Notes (Addendum)
Subjective:    SROM clear @ 1041. Per pt ctxs becoming stronger. POC discussed.  Objective:    VS: BP 133/70    Pulse 69    Temp 97.6 F (36.4 C) (Oral)    Resp 17    Ht 5\' 5"  (1.651 m)    Wt 89.9 kg    LMP 02/27/2021 (Exact Date)    SpO2 99%    BMI 32.97 kg/m  FHR : baseline 130 / variability moderate / accelerations none / early decelerations Toco: contractions every 1-3 minutes / Membranes: SROM clear Dilation: 3.5 Effacement (%): 80 Cervical Position: Posterior Station: -2 Presentation: Vertex Exam by:: Kim Majestic Brister CNM  Assessment/Plan:   31 y.o. G2P0010 [redacted]w[redacted]d IOL for preeclampsia without severe features. No severe range B/P  Labor:  Cytotec x 4 . Will start pitocin 2x2 if ctxs space. Preeclampsia:  no signs or symptoms of toxicity Fetal Wellbeing:  Category I Pain Control:  Epidural and IV pain meds prn I/D:  n/a Anticipated MOD:  NSVD Dr [redacted]w[redacted]d aware of pt status and POC Normand Sloop MSN, CNM 11/26/2021 2:12 PM

## 2021-11-26 NOTE — Anesthesia Procedure Notes (Signed)
Epidural Patient location during procedure: OB Start time: 11/26/2021 5:45 PM End time: 11/26/2021 5:58 PM  Staffing Anesthesiologist: Elmer Picker, MD Performed: anesthesiologist   Preanesthetic Checklist Completed: patient identified, IV checked, risks and benefits discussed, monitors and equipment checked, pre-op evaluation and timeout performed  Epidural Patient position: sitting Prep: DuraPrep and site prepped and draped Patient monitoring: continuous pulse ox, blood pressure, heart rate and cardiac monitor Approach: midline Location: L3-L4 Injection technique: LOR air  Needle:  Needle type: Tuohy  Needle gauge: 17 G Needle length: 9 cm Needle insertion depth: 5 cm Catheter type: closed end flexible Catheter size: 19 Gauge Catheter at skin depth: 10 cm Test dose: negative  Assessment Sensory level: T8 Events: blood not aspirated, injection not painful, no injection resistance, no paresthesia and negative IV test  Additional Notes Patient identified. Risks/Benefits/Options discussed with patient including but not limited to bleeding, infection, nerve damage, paralysis, failed block, incomplete pain control, headache, blood pressure changes, nausea, vomiting, reactions to medication both or allergic, itching and postpartum back pain. Confirmed with bedside nurse the patient's most recent platelet count. Confirmed with patient that they are not currently taking any anticoagulation, have any bleeding history or any family history of bleeding disorders. Patient expressed understanding and wished to proceed. All questions were answered. Sterile technique was used throughout the entire procedure. Please see nursing notes for vital signs. Test dose was given through epidural catheter and negative prior to continuing to dose epidural or start infusion. Warning signs of high block given to the patient including shortness of breath, tingling/numbness in hands, complete motor block,  or any concerning symptoms with instructions to call for help. Patient was given instructions on fall risk and not to get out of bed. All questions and concerns addressed with instructions to call with any issues or inadequate analgesia.  Reason for block:procedure for pain

## 2021-11-26 NOTE — Progress Notes (Signed)
Sharon Khan is a 31 y.o. G2P0010 at [redacted]w[redacted]d admitted for induction of labor due to new onset preeclampsia.  Subjective:  Denies HA, visual disturbances or epigastric pain. Resting comfortably. Preeclampsia and POC discussed. Questions answered.  Objective: BP (!) 159/81    Pulse 63    Temp 98 F (36.7 C) (Oral)    Resp 18    Ht 5\' 5"  (1.651 m)    Wt 89.9 kg    LMP 02/27/2021 (Exact Date)    SpO2 99%    BMI 32.97 kg/m  No intake/output data recorded. No intake/output data recorded.  FHT:  FHR: 135 bpm, variability: moderate,  accelerations:  Present,  decelerations:  Absent UC:   irregular, every 2-5 minutes SVE:   Dilation: 1 Effacement (%): 60 Station: -3 Exam by:: [email or web address], RN  Labs: Lab Results  Component Value Date   WBC 7.4 11/25/2021   HGB 13.6 11/25/2021   HCT 41.8 11/25/2021   MCV 83.9 11/25/2021   PLT 111 (L) 11/25/2021    Assessment / Plan: Induction of labor d/t new onset preeclampsia.   Labor:  Cytotec x 3  Fetal Wellbeing:  Category I Pain Control:  Epidural and IV pain meds prn I/D:   GBS negative Anticipated MOD:  NSVD  11/27/2021 Emilliano Dilworth MSN, CNM 11/26/2021, 5:51 AM

## 2021-11-26 NOTE — Lactation Note (Signed)
This note was copied from a baby's chart. Lactation Consultation Note  Patient Name: Sharon Khan [id number] Date: 11/26/2021 Reason for consult: L&D Initial assessment;1st time breastfeeding;Term Age:33 hours Mom was doing skin to skin when LC entered the room. Mom made repeated attempts to latch infant in football and cross cradle position, infant did not sustain latch and mostly lick and taste at the breast.  Mom could benefit from having hand pump prior to latching infant, see Latch score below. Mom will continue to latch infant according to hunger cues, 8 to 12+ or more times within 24 hours, skin to skin. Mom will ask for latch assistance on MBU from [place].  [place] congratulated mom on the birth of her daughter.  Maternal Data Does the patient have breastfeeding experience prior to this delivery?: No  Feeding Mother's Current Feeding Choice: Breast Milk  LATCH Score Latch: Repeated attempts needed to sustain latch, nipple held in mouth throughout feeding, stimulation needed to elicit sucking reflex.  Audible Swallowing: None  Type of Nipple: Everted at rest and after stimulation (Mom is short shafted and could benefit from using hand pump to pre-pump breast prior to latching infant.)  Comfort (Breast/Nipple): Soft / non-tender  Hold (Positioning): Assistance needed to correctly position infant at breast and maintain latch.  LATCH Score: 6   Lactation Tools Discussed/Used    Interventions Interventions: Assisted with latch;Skin to skin;Reverse pressure;Breast compression;Adjust position;Support pillows;Position options;Expressed milk;Education  Discharge    Consult Status Consult Status: Follow-up from L&D    Vicente Serene 11/26/2021, 11:20 PM

## 2021-11-26 NOTE — Progress Notes (Signed)
Subjective:    Ctxs becoming more painful. SVE 1/70/-3. Will do another cytotec. POC discussed. Questions answered.  Objective:    VS: BP (!) 141/74    Pulse 61    Temp 98.2 F (36.8 C) (Oral)    Resp 17    Ht 5\' 5"  (1.651 m)    Wt 89.9 kg    LMP 02/27/2021 (Exact Date)    SpO2 99%    BMI 32.97 kg/m  FHR : baseline 125 / variability moderate / accelerations present / absent decelerations Toco: contractions every 2-6 minutes  Membranes: intact Dilation: 1 Effacement (%): 70 Cervical Position: Posterior Station: -3 Presentation: Vertex Exam by:: Kim Jayke Caul CNM  Assessment/Plan:   31 y.o. G2P0010 [redacted]w[redacted]d IOL for preeclampsia without severe features. Denies HA, visual disturbances or epigastric pain. No severe range B/P.   Labor:  cytotec x 4 Preeclampsia:  no signs or symptoms of toxicity Fetal Wellbeing:  Category I Pain Control:  IV pain meds I/D:  GBS negative Anticipated MOD:  NSVD Dr [redacted]w[redacted]d aware of pt status and POC  Normand Sloop MSN, CNM 11/26/2021 10:19 AM

## 2021-11-26 NOTE — Progress Notes (Signed)
Subjective:    Comfortable with epidural.   Objective:    VS: BP (!) 144/72    Pulse 75    Temp 97.6 F (36.4 C) (Oral)    Resp 16    Ht 5\' 5"  (1.651 m)    Wt 89.9 kg    LMP 02/27/2021 (Exact Date)    SpO2 99%    BMI 32.97 kg/m  FHR : baseline 135 / variability moderate / accelerations present / early decelerations Toco: contractions every 2-4.5 minutes  Membranes: SROM @1041  Dilation: 4.5 Effacement (%): 80 Cervical Position: Posterior Station: -1 Presentation: Vertex Exam by:: Kim Kalven Ganim CNM   Assessment/Plan:   31 y.o. G2P0010 [redacted]w[redacted]d IOL for preeclampsia without severe features.   Labor:  Cytotec x 4 doses, SROM, Start pitocin titration 2x2 Preeclampsia:  no signs or symptoms of toxicity Fetal Wellbeing:  Category I Pain Control:  Epidural I/D:   GBS negative Anticipated MOD:  NSVD Dr 34 aware of pt status and POC  [redacted]w[redacted]d MSN, CNM 11/26/2021 6:24 PM

## 2021-11-27 ENCOUNTER — Encounter (HOSPITAL_COMMUNITY): Payer: Self-pay | Admitting: Obstetrics & Gynecology

## 2021-11-27 DIAGNOSIS — D509 Iron deficiency anemia, unspecified: Secondary | ICD-10-CM | POA: Diagnosis present

## 2021-11-27 LAB — CBC
HCT: 32.2 % — ABNORMAL LOW (ref 36.0–46.0)
Hemoglobin: 10.6 g/dL — ABNORMAL LOW (ref 12.0–15.0)
MCH: 27.5 pg (ref 26.0–34.0)
MCHC: 32.9 g/dL (ref 30.0–36.0)
MCV: 83.6 fL (ref 80.0–100.0)
Platelets: 96 10*3/uL — ABNORMAL LOW (ref 150–400)
RBC: 3.85 MIL/uL — ABNORMAL LOW (ref 3.87–5.11)
RDW: 13.2 % (ref 11.5–15.5)
WBC: 11.6 10*3/uL — ABNORMAL HIGH (ref 4.0–10.5)
nRBC: 0 % (ref 0.0–0.2)

## 2021-11-27 MED ORDER — ACETAMINOPHEN 325 MG PO TABS
650.0000 mg | ORAL_TABLET | ORAL | Status: DC | PRN
Start: 2021-11-27 — End: 2021-11-28
  Administered 2021-11-27 – 2021-11-28 (×3): 650 mg via ORAL
  Filled 2021-11-27 (×3): qty 2

## 2021-11-27 MED ORDER — DIBUCAINE (PERIANAL) 1 % EX OINT: 1.0000 "application " | TOPICAL_OINTMENT | CUTANEOUS | Status: DC | PRN

## 2021-11-27 MED ORDER — ZOLPIDEM TARTRATE 5 MG PO TABS: 5.0000 mg | ORAL_TABLET | Freq: Every evening | ORAL | Status: DC | PRN

## 2021-11-27 MED ORDER — ONDANSETRON HCL 4 MG PO TABS: 4.0000 mg | ORAL_TABLET | ORAL | Status: DC | PRN

## 2021-11-27 MED ORDER — BENZOCAINE-MENTHOL 20-0.5 % EX AERO
1.0000 "application " | INHALATION_SPRAY | CUTANEOUS | Status: DC | PRN
  Filled 2021-11-27 (×2): qty 56

## 2021-11-27 MED ORDER — DIPHENHYDRAMINE HCL 25 MG PO CAPS: 25.0000 mg | ORAL_CAPSULE | Freq: Four times a day (QID) | ORAL | Status: DC | PRN

## 2021-11-27 MED ORDER — OXYCODONE HCL 5 MG PO TABS: 5.0000 mg | ORAL_TABLET | ORAL | Status: DC | PRN

## 2021-11-27 MED ORDER — TETANUS-DIPHTH-ACELL PERTUSSIS 5-2.5-18.5 LF-MCG/0.5 IM SUSY: 0.5000 mL | PREFILLED_SYRINGE | Freq: Once | INTRAMUSCULAR | Status: DC

## 2021-11-27 MED ORDER — WITCH HAZEL-GLYCERIN EX PADS: 1.0000 "application " | MEDICATED_PAD | CUTANEOUS | Status: DC | PRN

## 2021-11-27 MED ORDER — OXYCODONE HCL 5 MG PO TABS: 10.0000 mg | ORAL_TABLET | ORAL | Status: DC | PRN

## 2021-11-27 MED ORDER — PRENATAL MULTIVITAMIN CH
1.0000 | ORAL_TABLET | Freq: Every day | ORAL | Status: DC
  Administered 2021-11-27 – 2021-11-28 (×2): 1 via ORAL
  Filled 2021-11-27 (×2): qty 1

## 2021-11-27 MED ORDER — SIMETHICONE 80 MG PO CHEW: 80.0000 mg | CHEWABLE_TABLET | ORAL | Status: DC | PRN

## 2021-11-27 MED ORDER — IBUPROFEN 600 MG PO TABS
600.0000 mg | ORAL_TABLET | Freq: Four times a day (QID) | ORAL | Status: DC
  Administered 2021-11-27 – 2021-11-28 (×2): 600 mg via ORAL
  Filled 2021-11-27 (×2): qty 1

## 2021-11-27 MED ORDER — COCONUT OIL OIL: 1.0000 "application " | TOPICAL_OIL | Status: DC | PRN

## 2021-11-27 MED ORDER — SENNOSIDES-DOCUSATE SODIUM 8.6-50 MG PO TABS
2.0000 | ORAL_TABLET | Freq: Every day | ORAL | Status: DC
  Administered 2021-11-27 – 2021-11-28 (×2): 2 via ORAL
  Filled 2021-11-27 (×3): qty 2

## 2021-11-27 MED ORDER — ONDANSETRON HCL 4 MG/2ML IJ SOLN: 4.0000 mg | INTRAMUSCULAR | Status: DC | PRN

## 2021-11-27 NOTE — Lactation Note (Signed)
This note was copied from a baby's chart. Lactation Consultation Note  Patient Name: Sharon Khan [id number] Date: 11/27/2021 Reason for consult: Follow-up assessment;Primapara;1st time breastfeeding;Term Age:33 hours  LC visited with [name] of term baby.  Baby was just fed 38 ml of formula and Mom was burping baby.  Baby has been to the breast a few times and suckles for a few minutes.  Mom has supplemented baby with 4 bottles.  Mom's goal is to breastfeed her baby.  Mom has pumped once.  Reviewed how to care of pump parts and LC disassembled parts and washed, rinsing and placed in separate bin for drying.  24 mm flanges appear to be a good fit.    Encouraged Mom to place baby STS on her chest and call for [facility] assistance when baby is showing feeding cues.  Reviewed early feeding cues.  Mom has been feeding baby every 3 hrs with large volumes for the first hours of life (30-38 ml).    Mom chose to have LC change baby's diaper and swaddle her in crib so Mom can rest.  After her nap, encouraged Mom to place baby STS on her chest to encouraged cues.  Maternal Data Has patient been taught Hand Expression?: Yes Does the patient have breastfeeding experience prior to this delivery?: No  Feeding Mother's Current Feeding Choice: Breast Milk and Formula Nipple Type: Slow - flow   Lactation Tools Discussed/Used Tools: Pump;Flanges;Bottle Flange Size: 24 Breast pump type: Double-Electric Breast Pump Pump Education: Setup, frequency, and cleaning;Milk Storage Reason for Pumping: Support milk supply/infant being supplemented with formula Pumping frequency: Mom has pumped once.  Encouraged Mom to pump if baby is supplemented with formula Pumped volume: 0 mL (drops) Discharge Pump: Personal (hand's free pump, Mom purchasing a Spectra DEBP)  Consult Status Consult Status: Follow-up Date: 11/28/21 Follow-up type: In-patient    Sharon Khan 11/27/2021, 12:16 PM

## 2021-11-27 NOTE — Anesthesia Postprocedure Evaluation (Signed)
Anesthesia Post Note  Patient: Sharon Khan  Procedure(s) Performed: AN AD [place]     Patient location during evaluation: Mother Baby Anesthesia Type: Epidural Level of consciousness: awake and alert and oriented Pain management: satisfactory to patient Vital Signs Assessment: post-procedure vital signs reviewed and stable Respiratory status: respiratory function stable Cardiovascular status: stable Postop Assessment: no headache, no backache, epidural receding, patient able to bend at knees, no signs of nausea or vomiting, adequate PO intake and able to ambulate Anesthetic complications: no   No notable events documented.  Last Vitals:  Vitals:   11/27/21 0610 11/27/21 0703  BP: 139/73 (!) 119/56  Pulse: 91 92  Resp: 16 16  Temp: 36.9 C   SpO2: 98%     Last Pain:  Vitals:   11/27/21 0700  TempSrc:   PainSc: 0-No pain   Pain Goal:                   Sharon Khan

## 2021-11-27 NOTE — Progress Notes (Signed)
PPD# 1 SVD w/ 1st degree laceration Information for the patient's newborn:  Rocsi, Hazelbaker [[id number]]  female   Baby Name "Layali" Circumcision N/A   S:   Reports feeling very good, denies HA, visual changes, and RUQ pain Tolerating PO fluid and solids No nausea or vomiting Bleeding is light, no clots Pain controlled with  PO meds excluding Ibuprofen Up ad lib / ambulatory / voiding w/o difficulty Feeding: Bottle, desires to breastfeed and is waiting for lactation   O:   VS: BP (!) 119/56 (BP Location: Left Arm)    Pulse 92    Temp 98.5 F (36.9 C) (Oral)    Resp 16    Ht 5\' 5"  (1.651 m)    Wt 89.9 kg    LMP 02/27/2021 (Exact Date)    SpO2 98%    BMI 32.97 kg/m   LABS:  Recent Labs    11/26/21 2316 11/27/21 0406  WBC 11.3* 11.6*  HGB 11.4* 10.6*  PLT 93* 96*   Blood type: --/--/A POS (02/20 2035) Rubella: Immune (08/29 0000)                      I&O: Intake/Output      02/21 0701 02/22 0700 02/22 0701 02/23 0700   P.O. 360    I.V. (mL/kg) 681.3 (7.6)    Total Intake(mL/kg) 1041.3 (11.6)    Urine (mL/kg/hr) 600 (0.3)    Blood 250    Total Output 850    Net +191.3           Physical Exam: Alert and oriented X3 Lungs: Clear and unlabored Heart: regular rate and rhythm / no mumurs Abdomen: soft, non-tender, non-distended  Fundus: firm, non-tender, U-2 Perineum: healing Lochia: appropriate Extremities: no edema, no calf pain, tenderness, or cords    A:  PPD # 1  Pre-eclampsia without severe features Normal PP exam  P:  Routine post partum orders DC MgSO4 @ 1100 (12 hrs of treatment) Anticipate D/C on 11/28/21  Plan reviewed w/ Dr. 11/30/21, MSN, CNM 11/27/2021, 9:23 AM

## 2021-11-28 LAB — CBC
HCT: 29.8 % — ABNORMAL LOW (ref 36.0–46.0)
Hemoglobin: 9.3 g/dL — ABNORMAL LOW (ref 12.0–15.0)
MCH: 26.6 pg (ref 26.0–34.0)
MCHC: 31.2 g/dL (ref 30.0–36.0)
MCV: 85.4 fL (ref 80.0–100.0)
Platelets: 87 10*3/uL — ABNORMAL LOW (ref 150–400)
RBC: 3.49 MIL/uL — ABNORMAL LOW (ref 3.87–5.11)
RDW: 13.4 % (ref 11.5–15.5)
WBC: 7.4 10*3/uL (ref 4.0–10.5)
nRBC: 0 % (ref 0.0–0.2)

## 2021-11-28 LAB — COMPREHENSIVE METABOLIC PANEL
ALT: 20 U/L (ref 0–44)
AST: 29 U/L (ref 15–41)
Albumin: 2.3 g/dL — ABNORMAL LOW (ref 3.5–5.0)
Alkaline Phosphatase: 111 U/L (ref 38–126)
Anion gap: 5 (ref 5–15)
BUN: 9 mg/dL (ref 6–20)
CO2: 26 mmol/L (ref 22–32)
Calcium: 8 mg/dL — ABNORMAL LOW (ref 8.9–10.3)
Chloride: 105 mmol/L (ref 98–111)
Creatinine, Ser: 0.62 mg/dL (ref 0.44–1.00)
GFR, Estimated: >60 mL/min (ref >60)
Glucose, Bld: 91 mg/dL (ref 70–99)
Potassium: 4.2 mmol/L (ref 3.5–5.1)
Sodium: 136 mmol/L (ref 135–145)
Total Bilirubin: 0.2 mg/dL — ABNORMAL LOW (ref 0.3–1.2)
Total Protein: 4.6 g/dL — ABNORMAL LOW (ref 6.5–8.1)

## 2021-11-28 MED ORDER — HYDROCHLOROTHIAZIDE 25 MG PO TABS
25.0000 mg | ORAL_TABLET | Freq: Every day | ORAL | Status: DC
  Administered 2021-11-28: 25 mg via ORAL
  Filled 2021-11-28: qty 1

## 2021-11-28 MED ORDER — HYDROCHLOROTHIAZIDE 25 MG PO TABS: 25.0000 mg | ORAL_TABLET | Freq: Every day | ORAL | 6 refills | Status: DC

## 2021-11-28 MED ORDER — IBUPROFEN 600 MG PO TABS: 600.0000 mg | ORAL_TABLET | Freq: Four times a day (QID) | ORAL | 2 refills | Status: AC | PRN

## 2021-11-28 MED ORDER — NIFEDIPINE ER 30 MG PO TB24: 30.0000 mg | ORAL_TABLET | Freq: Every day | ORAL | 6 refills | Status: DC

## 2021-11-28 MED ORDER — NIFEDIPINE ER OSMOTIC RELEASE 30 MG PO TB24
30.0000 mg | ORAL_TABLET | Freq: Every day | ORAL | Status: DC
  Administered 2021-11-28: 30 mg via ORAL
  Filled 2021-11-28: qty 1

## 2021-11-28 NOTE — Progress Notes (Signed)
Discharge instructions given to patient, all questions and concerns addressed, and patient verbalized understanding. Patient is calling to set-up follow-up appointment. Medications, HTN in postpartum period, and when to call the provider discussed. Patient states will have BP cuff at home and also has a breast pump at home. Patient is alert and oriented x4, ambulatory, and VS and pain stable.

## 2021-11-28 NOTE — Plan of Care (Signed)
°  Problem: Education: °Goal: Knowledge of General Education information will improve °Description: Including pain rating scale, medication(s)/side effects and non-pharmacologic comfort measures °Outcome: Adequate for Discharge °  °Problem: Health Behavior/Discharge Planning: °Goal: Ability to manage health-related needs will improve °Outcome: Adequate for Discharge °  °Problem: Clinical Measurements: °Goal: Ability to maintain clinical measurements within normal limits will improve °Outcome: Adequate for Discharge °Goal: Will remain free from infection °Outcome: Adequate for Discharge °Goal: Diagnostic test results will improve °Outcome: Adequate for Discharge °Goal: Respiratory complications will improve °Outcome: Adequate for Discharge °Goal: Cardiovascular complication will be avoided °Outcome: Adequate for Discharge °  °Problem: Activity: °Goal: Risk for activity intolerance will decrease °Outcome: Adequate for Discharge °  °Problem: Nutrition: °Goal: Adequate nutrition will be maintained °Outcome: Adequate for Discharge °  °Problem: Coping: °Goal: Level of anxiety will decrease °Outcome: Adequate for Discharge °  °Problem: Elimination: °Goal: Will not experience complications related to bowel motility °Outcome: Adequate for Discharge °Goal: Will not experience complications related to urinary retention °Outcome: Adequate for Discharge °  °Problem: Pain Managment: °Goal: General experience of comfort will improve °Outcome: Adequate for Discharge °  °Problem: Safety: °Goal: Ability to remain free from injury will improve °Outcome: Adequate for Discharge °  °Problem: Skin Integrity: °Goal: Risk for impaired skin integrity will decrease °Outcome: Adequate for Discharge °  °Problem: Education: °Goal: Knowledge of disease or condition will improve °Outcome: Adequate for Discharge °Goal: Knowledge of the prescribed therapeutic regimen will improve °Outcome: Adequate for Discharge °  °Problem: Fluid Volume: °Goal:  Peripheral tissue perfusion will improve °Outcome: Adequate for Discharge °  °Problem: Clinical Measurements: °Goal: Complications related to disease process, condition or treatment will be avoided or minimized °Outcome: Adequate for Discharge °  °Problem: Education: °Goal: Knowledge of condition will improve °Outcome: Adequate for Discharge °Goal: Individualized Educational Video(s) °Outcome: Adequate for Discharge °Goal: Individualized Newborn Educational Video(s) °Outcome: Adequate for Discharge °  °Problem: Activity: °Goal: Will verbalize the importance of balancing activity with adequate rest periods °Outcome: Adequate for Discharge °Goal: Ability to tolerate increased activity will improve °Outcome: Adequate for Discharge °  °Problem: Coping: °Goal: Ability to identify and utilize available resources and services will improve °Outcome: Adequate for Discharge °  °Problem: Life Cycle: °Goal: Chance of risk for complications during the postpartum period will decrease °Outcome: Adequate for Discharge °  °Problem: Role Relationship: °Goal: Ability to demonstrate positive interaction with newborn will improve °Outcome: Adequate for Discharge °  °Problem: Skin Integrity: °Goal: Demonstration of wound healing without infection will improve °Outcome: Adequate for Discharge °  °

## 2021-11-28 NOTE — Discharge Summary (Signed)
[facility] Discharge Summary   Patient Name:   Sharon Khan DOB:     10-Oct-1990 MRN:     [id number]  Date of Admission:   11/25/2021 Date of Discharge:  11/28/2021  Admitting diagnosis:    Gestational hypertension [O13.9] Active Problems:   Pre-eclampsia in third trimester   Thrombocytopenia affecting pregnancy ([place])   IDA (iron deficiency anemia)   SVD (spontaneous vaginal delivery)     Discharge diagnosis:    Gestational hypertension [O13.9] Active Problems:   Pre-eclampsia in third trimester   Thrombocytopenia affecting pregnancy ([place])   IDA (iron deficiency anemia)   SVD (spontaneous vaginal delivery)  Term Pregnancy Delivered        Additional problems: None                                          Post partum procedures: None Augmentation: Pitocin Complications: None  Hospital course: Induction of Labor With Vaginal Delivery   31 y.o. yo G2P0010 at 86w6dwas admitted to the hospital 11/25/2021 for induction of labor.  Indication for induction: PROM.  Patient had an uncomplicated labor course as follows: Membrane Rupture Time/Date: 10:41 AM ,11/26/2021   Delivery Method:Vaginal, Spontaneous  Episiotomy: None  Lacerations:  1st degree  Details of delivery can be found in separate delivery note.  Patient with elevated BP on PPD#2 with lower extremity edema. Repeat CBC and CMP was drawn. Procardia and Hydrochlorothiazide added and blood pressures normalized. Patient is discharged home 11/28/21.  Newborn Data: Birth date:11/26/2021  Birth time:10:15 PM  Gender:Female  Living status:Living  Apgars:8 ,9  Weight:3260 g   Magnesium Sulfate received: Yes: Seizure prophylaxis BMZ received: No Rhophylac:N/A MMR:No T-DaP:Given prenatally Flu: No Transfusion:No                                                               Type of Delivery:  NSVD Delivering Provider: HDomingo Pulse Date of Delivery:  11/26/21  Newborn Data:  Baby Feeding:    Bottle Disposition:   home with mother  Physical Exam:   Vitals:   11/28/21 0822 11/28/21 1015 11/28/21 1148 11/28/21 1411  BP: (!) 142/80 (!) 145/72 136/70 131/66  Pulse: 67 74 82 89  Resp: 16  16 16   Temp: 98 F (36.7 C)  98.1 F (36.7 C) 97.9 F (36.6 C)  TempSrc: Oral  Oral Oral  SpO2: 100%  95% 99%  Weight:      Height:       General: alert, cooperative, and no distress Lochia: appropriate Uterine Fundus: firm Incision: N/A DVT Evaluation: No evidence of DVT seen on physical exam. Negative Homan's sign. Calf/Ankle edema is present  Labs: Lab Results  Component Value Date   WBC 7.4 11/28/2021   HGB 9.3 (L) 11/28/2021   HCT 29.8 (L) 11/28/2021   MCV 85.4 11/28/2021   PLT 87 (L) 11/28/2021   CMP Latest Ref Rng & Units 11/28/2021  Glucose 70 - 99 mg/dL 91  BUN 6 - 20 mg/dL 9  Creatinine 0.44 - 1.00 mg/dL 0.62  Sodium 135 - 145 mmol/L 136  Potassium 3.5 - 5.1 mmol/L 4.2  Chloride 98 - 111 mmol/L  105  CO2 22 - 32 mmol/L 26  Calcium 8.9 - 10.3 mg/dL 8.0(L)  Total Protein 6.5 - 8.1 g/dL 4.6(L)  Total Bilirubin 0.3 - 1.2 mg/dL 0.2(L)  Alkaline Phos 38 - 126 U/L 111  AST 15 - 41 U/L 29  ALT 0 - 44 U/L 20    Discharge instruction: per After Visit Summary and "Baby and Me Booklet".  After Visit Meds:  Allergies as of 11/28/2021   No Known Allergies      Medication List     TAKE these medications    ferrous sulfate 325 (65 FE) MG tablet Take 325 mg by mouth daily with breakfast.   hydrochlorothiazide 25 MG tablet Commonly known as: HYDRODIURIL Take 1 tablet (25 mg total) by mouth daily. Start taking on: November 29, 2021   ibuprofen 600 MG tablet Commonly known as: ADVIL Take 1 tablet (600 mg total) by mouth every 6 (six) hours as needed for cramping or moderate pain.   metroNIDAZOLE 500 MG tablet Commonly known as: FLAGYL Take 1 tablet (500 mg total) by mouth 2 (two) times daily.   naproxen 500 MG tablet Commonly known as: NAPROSYN Take 1  tablet (500 mg total) by mouth 2 (two) times daily.   NIFEdipine 30 MG 24 hr tablet Commonly known as: ADALAT CC Take 1 tablet (30 mg total) by mouth daily. Start taking on: November 29, 2021   prenatal multivitamin Tabs tablet Take 1 tablet by mouth daily at 12 noon.        Diet: low salt diet  Activity: Advance as tolerated. Pelvic rest for 6 weeks.   Outpatient follow up:1 week Follow up Appt:No future appointments. Follow up visit: No follow-ups on file.  Postpartum contraception: Not Discussed  11/28/2021 Sanjuana Kava, MD

## 2021-11-28 NOTE — Progress Notes (Signed)
MD PROGRESS NOTE  Sharon Khan [id number]  PPD# 2 SVD w/ Selena Batten  Subjective:   Patient doing well.  She had some pressure behind her eyes yesterday but denies headache, blurry vision, scotomata or RUQ pain.  She is tolerating PO fluid and solids w/o nausea or vomiting Bleeding is light Up ad lib / ambulatory / voiding w/o difficulty Feeding: Breast. Mother and baby are bonding well.    Objective:   VS: BP (!) 142/80 (BP Location: Right Arm)    Pulse 67    Temp 98 F (36.7 C) (Oral)    Resp 16    Ht 5\' 5"  (1.651 m)    Wt 89.9 kg    LMP 02/27/2021 (Exact Date)    SpO2 100%    BMI 32.97 kg/m   Physical Exam: Alert and oriented X3 Lungs: Clear and unlabored Heart: regular rate and rhythm / no mumurs Abdomen: soft, non-tender, non-distended  Fundus: firm, non-tender, U-1 Perineum: intact Lochia: minimal Extremities: 2+ edema, no calf pain, tenderness, or cords  Labs:  Recent Labs    11/26/21 2316 11/27/21 0406  WBC 11.3* 11.6*  HGB 11.4* 10.6*  PLT 93* 96*   Blood type: --/--/A POS (02/20 2035) Rubella: Immune (08/29 0000)                     Assessment:  PPD # 1 s/p NSVD doing well s/p magnesium sulfate for  Preeclampsia w/o severe features. Blood pressure has elevated again to mild range. Thrombocytopenia present. Marked lower extremity edema  Plan:  Will get new CBC, CMP now Will start Procardia and hydrochlorothiazide for diuresis.  TED hose for DVT prophylaxis and to aid with edema Will increase BP checks to q 2 hours.  If BP responds to oral meds, possible discharge home this afternoon    11-18-2003, MD 11/28/2021, 9:47 AM

## 2021-11-28 NOTE — Lactation Note (Signed)
This note was copied from a baby's chart. Lactation Consultation Note  Patient Name: Sharon Khan [id number]'U Date: 11/28/2021 Reason for consult: Mother's request;Follow-up assessment Age:33 hours  P1, Mother requested assistance with latching.  Baby has primarily been formula fed with bottles. Observed end of pumping session with 24 flanges.  Mother pumped 6 ml. Attempted latching and baby came off fussy at the breast. Due to baby being used to artificial nipple, applied #20NS and prefilled with colostrum.  Baby sustained latch for 20 min.  Gave baby remaining colostrum with finger and curved tip syringe. Discussed pumping after feedings and giving volume back to baby.  Attempt at least once per day without NS. Suggest OP appt. Feed on demand with cues.  Goal 8-12+ times per day after first 24 hrs.  Place baby STS if not cueing.  Mother will supplement with formula until her milk supply is established.   Maternal Data Does the patient have breastfeeding experience prior to this delivery?: No  Feeding Mother's Current Feeding Choice: Breast Milk and Formula  LATCH Score Latch: Repeated attempts needed to sustain latch, nipple held in mouth throughout feeding, stimulation needed to elicit sucking reflex.  Audible Swallowing: A few with stimulation  Type of Nipple: Everted at rest and after stimulation  Comfort (Breast/Nipple): Soft / non-tender  Hold (Positioning): Assistance needed to correctly position infant at breast and maintain latch.  LATCH Score: 7   Lactation Tools Discussed/Used Tools: Pump;Nipple Shields Breast pump type: Double-Electric Breast Pump Reason for Pumping: stimulation and supplementation Pumping frequency:  (Recommend q 3 hours) Pumped volume: 6 mL  Interventions Interventions: Breast feeding basics reviewed;Assisted with latch;Skin to skin;Breast compression;Adjust position;Support pillows;DEBP;Education  Discharge Discharge Education:  Engorgement and breast care;Warning signs for feeding baby;Outpatient recommendation Pump: Personal;DEBP (Spectra)  Consult Status Consult Status: Complete Date: 11/28/21    Dahlia Byes [facility] 11/28/2021, 11:19 AM

## 2021-12-05 ENCOUNTER — Telehealth (HOSPITAL_COMMUNITY): Payer: Self-pay

## 2021-12-05 NOTE — Telephone Encounter (Signed)
"  Doing fine, recovering from the stitches. How long does it take for it to heal?" RN told patient that typically the healing process is complete by 6 weeks postpartum. RN told patient that her OB-GYN will see her for a postpartum appointment around 6 weeks postpartum and do an exam. RN reviewed peri care with patient. RN told patient to reach out to her OB if she has concerns or questions about her healing or increased pain. "I saw my OB yesterday and they checked and said that everything looked good and my stitches were healing good." Patient declines any other questions or concerns about her healing. ? ?"She's doing great. Eating a lot and pooping a lot. Is it normal for her poop to be yellow green?" RN reviewed what normal newborn stool diapers should look like. "She has been to the pediatrician and they said she looked great, she has another appointment on the 8th. She sleeps in a bassinet."  RN reviewed ABC's of safe sleep with patient. Patient declines any questions or concerns about baby. ? ?EPDS score is 6. ? ?Marcelino Duster Glorianne Proctor,RN3,MSN,RNC-MNN ?12/05/2021,2000 ?

## 2022-01-31 ENCOUNTER — Encounter: Payer: Self-pay | Admitting: Internal Medicine

## 2022-04-29 ENCOUNTER — Ambulatory Visit: Payer: Medicaid Other | Admitting: Internal Medicine

## 2022-10-17 IMAGING — US US OB < 14 WEEKS - US OB TV
1 series · 14 of 28 positions shown · non-contrast
Comparison: None.

CLINICAL DATA: Vaginal bleeding

EXAM:
OBSTETRIC <14 WK US AND TRANSVAGINAL OB US
TECHNIQUE: Both transabdominal and transvaginal ultrasound examinations were
performed for complete evaluation of the gestation as well as the
maternal uterus, adnexal regions, and pelvic cul-de-sac.
Transvaginal technique was performed to assess early pregnancy.

[Series 1: us ob comp less 14 wks · 14 of 71 slices shown]
[im 3/71]
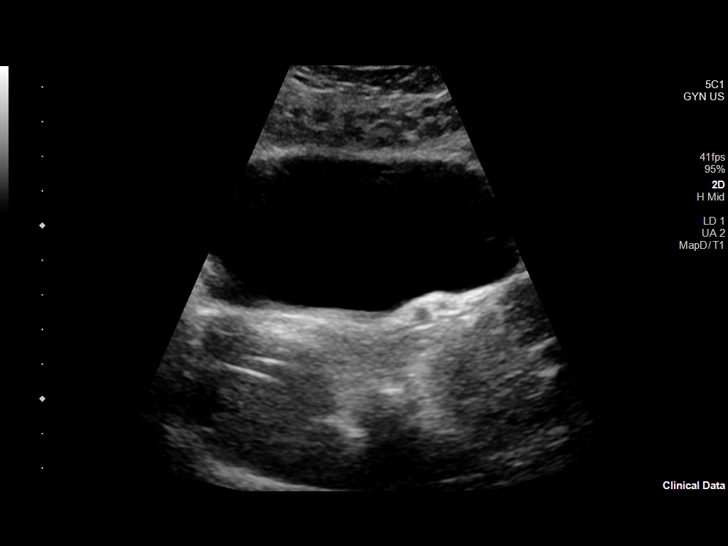
[im 8/71]
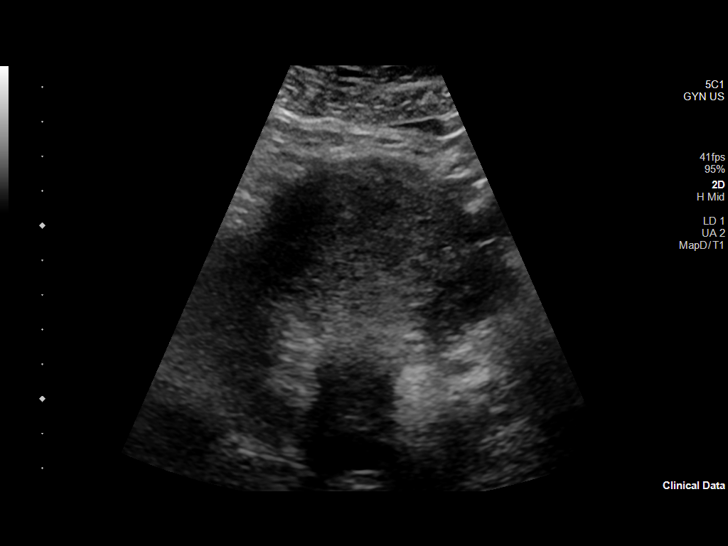
[im 13/71]
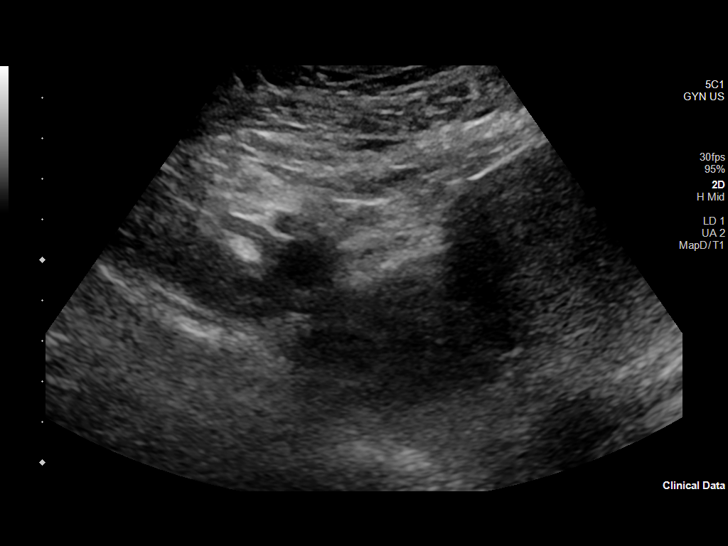
[im 19/71]
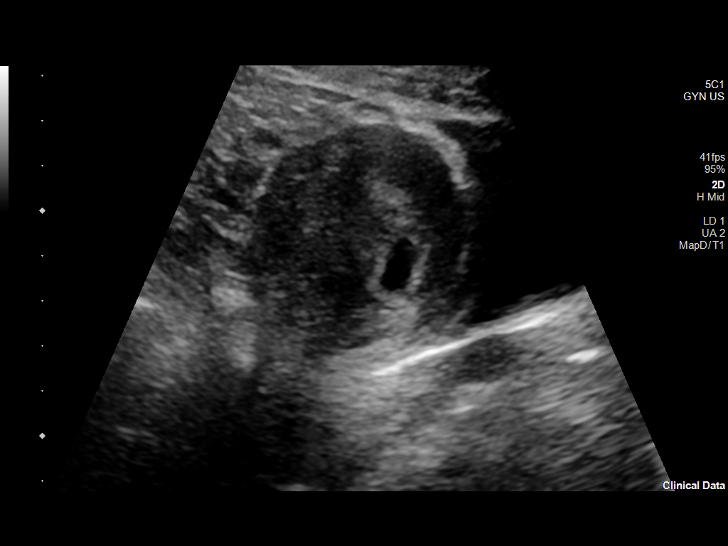
[im 24/71]
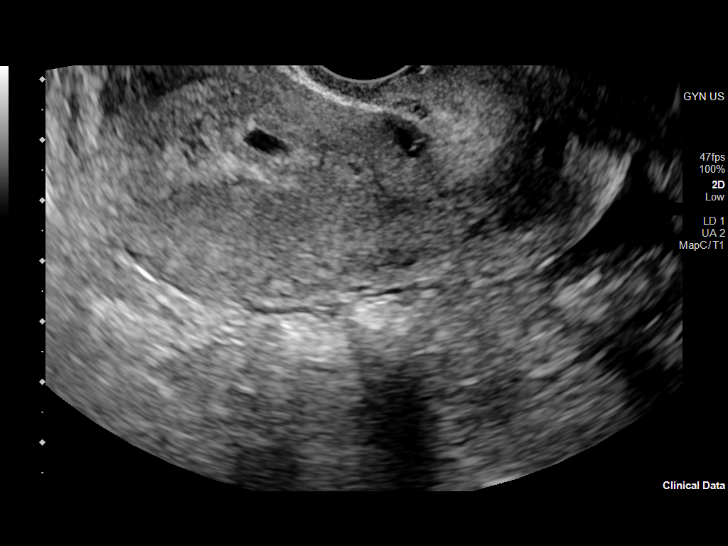
[im 29/71]
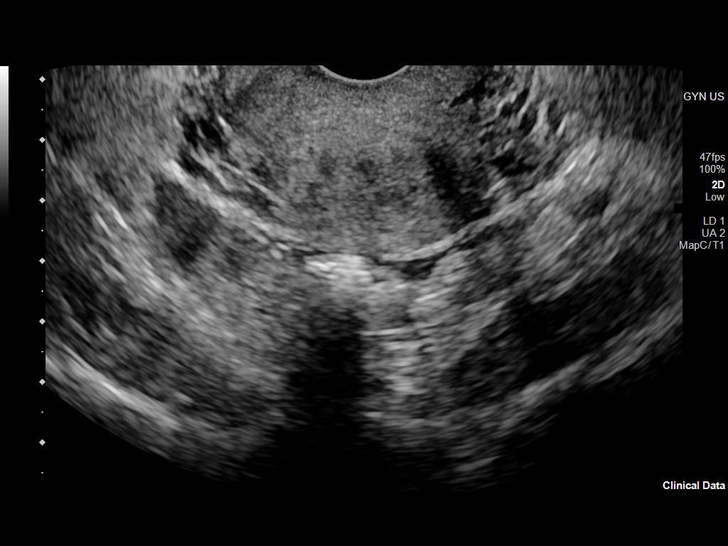
[im 34/71]
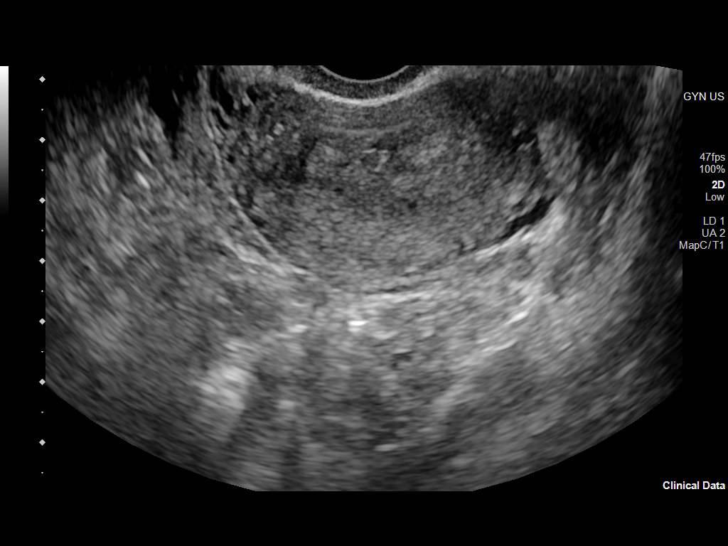
[im 39/71]
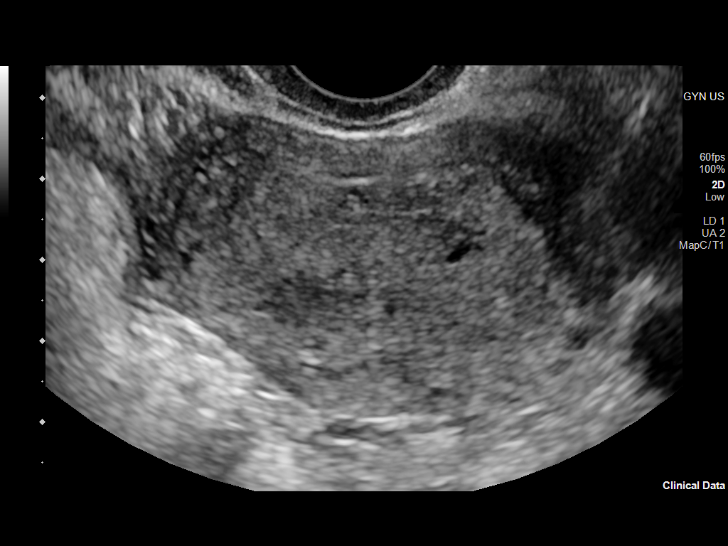
[im 45/71]
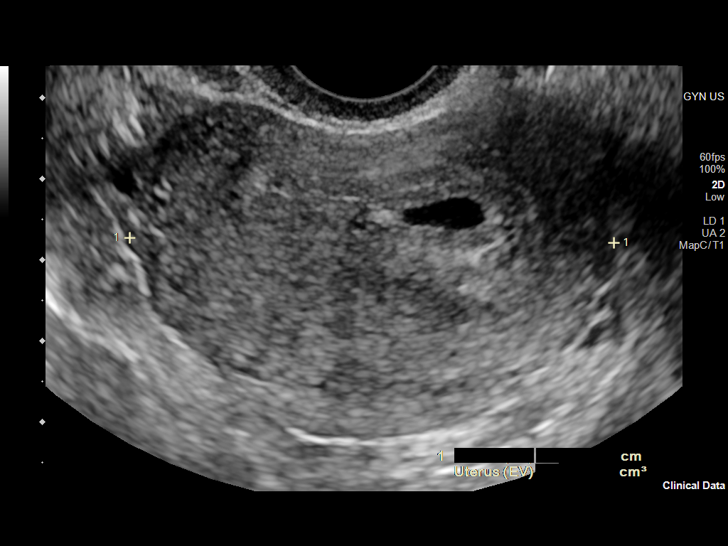
[im 50/71]
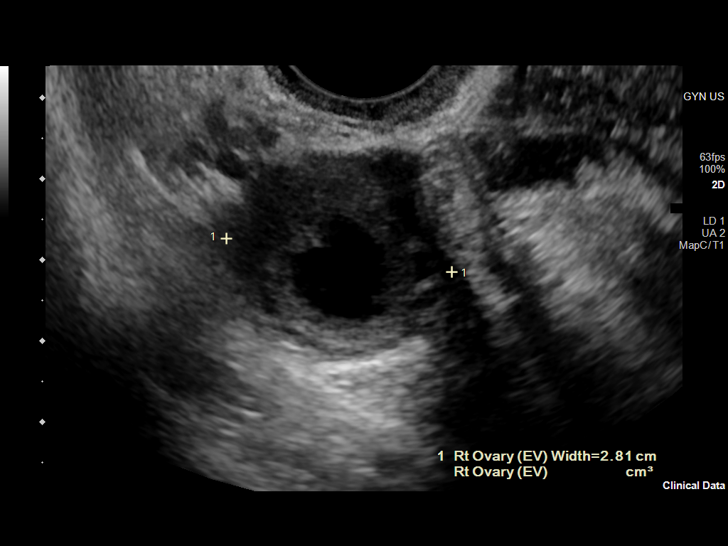
[im 55/71]
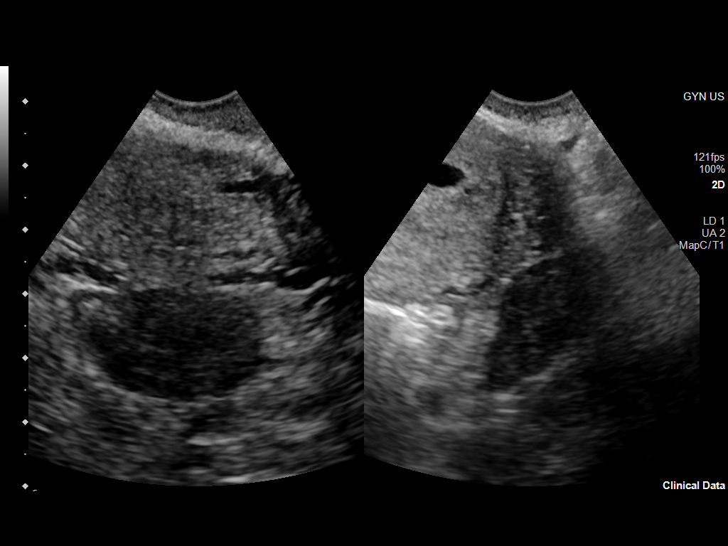
[im 60/71]
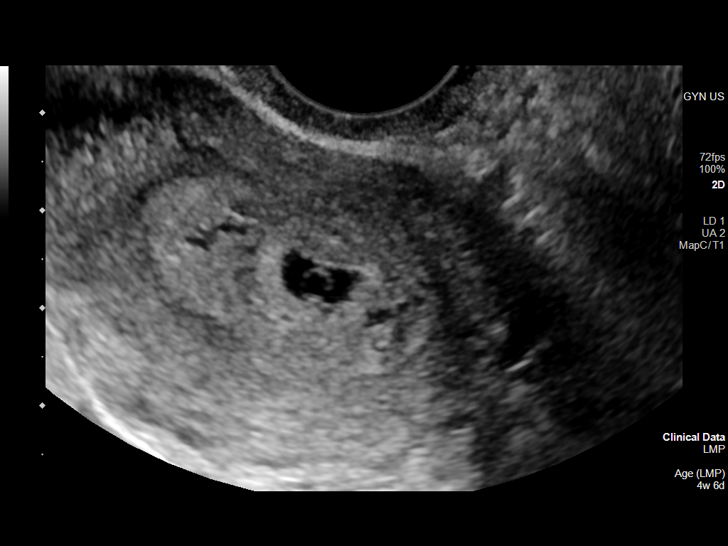
[im 65/71]
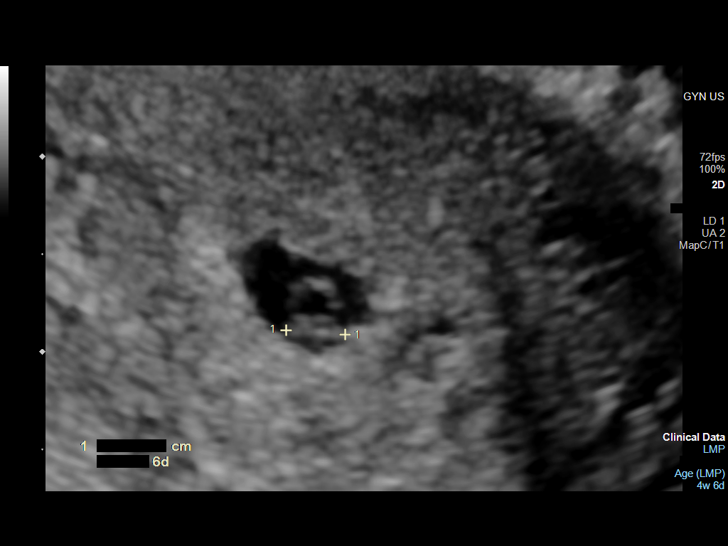
[im 71/71]
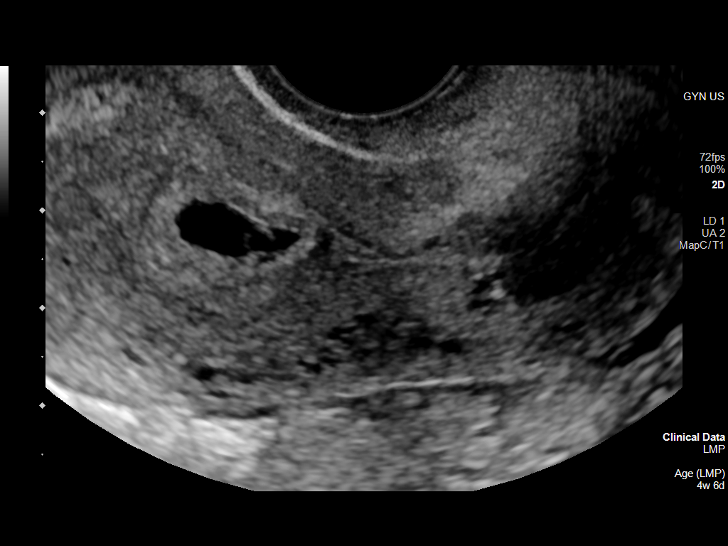

[14 of 28 positions shown; findings below may reference images not displayed]

FINDINGS: Intrauterine gestational sac: Single IUP

Yolk sac:  Visualized

Embryo:  Probably visualized

Cardiac Activity: Not visualized

CRL: 2.2 mm   5 w   5 d                  US EDC: 11/28/2021

Subchorionic hemorrhage:  None visualized.

Maternal uterus/adnexae: Ovaries are within normal limits. Right
ovary measures 4.6 x 2.6 x 2.8 cm and contains a corpus luteum. The
left ovary measures 2.8 x 1.8 x 2.6 cm. Small free fluid
IMPRESSION: 1. Single intrauterine pregnancy with visible gestational sac, yolk
sac and probable tiny embryo. Unable to visualize cardiac activity,
but this may be related to extremely small size of embryo. Suggest
sonographic follow-up in 10-14 days to reassess viability.
2. Small free fluid

## 2023-08-25 ENCOUNTER — Emergency Department (HOSPITAL_BASED_OUTPATIENT_CLINIC_OR_DEPARTMENT_OTHER)
Admission: EM | Admit: 2023-08-25 | Discharge: 2023-08-25 | Disposition: A | Discharge status: Home / Self Care | Payer: Medicaid Other | Attending: Emergency Medicine | Admitting: Emergency Medicine

## 2023-08-25 ENCOUNTER — Emergency Department (HOSPITAL_BASED_OUTPATIENT_CLINIC_OR_DEPARTMENT_OTHER): Payer: Medicaid Other

## 2023-08-25 ENCOUNTER — Encounter (HOSPITAL_BASED_OUTPATIENT_CLINIC_OR_DEPARTMENT_OTHER): Payer: Self-pay | Admitting: Urology

## 2023-08-25 ENCOUNTER — Other Ambulatory Visit: Payer: Self-pay

## 2023-08-25 DIAGNOSIS — M545 Low back pain, unspecified: Secondary | ICD-10-CM | POA: Insufficient documentation

## 2023-08-25 DIAGNOSIS — W108XXA Fall (on) (from) other stairs and steps, initial encounter: Secondary | ICD-10-CM | POA: Insufficient documentation

## 2023-08-25 DIAGNOSIS — M25532 Pain in left wrist: Secondary | ICD-10-CM | POA: Diagnosis not present

## 2023-08-25 DIAGNOSIS — R519 Headache, unspecified: Secondary | ICD-10-CM | POA: Insufficient documentation

## 2023-08-25 DIAGNOSIS — S060X0A Concussion without loss of consciousness, initial encounter: Secondary | ICD-10-CM | POA: Insufficient documentation

## 2023-08-25 MED ORDER — LIDOCAINE 5 % EX PTCH: 2.0000 | MEDICATED_PATCH | CUTANEOUS | 0 refills | Status: DC

## 2023-08-25 MED ORDER — KETOROLAC TROMETHAMINE 15 MG/ML IJ SOLN
15.0000 mg | Freq: Once | INTRAMUSCULAR | Status: AC
  Administered 2023-08-25: 15 mg via INTRAMUSCULAR

## 2023-08-25 MED ORDER — KETOROLAC TROMETHAMINE 15 MG/ML IJ SOLN
15.0000 mg | Freq: Once | INTRAMUSCULAR | Status: DC
Start: 2023-08-25 — End: 2023-08-25
  Filled 2023-08-25: qty 1

## 2023-08-25 MED ORDER — METAXALONE 800 MG PO TABS: 800.0000 mg | ORAL_TABLET | Freq: Three times a day (TID) | ORAL | 0 refills | Status: DC | PRN

## 2023-08-25 NOTE — ED Triage Notes (Signed)
Pt states fall down approx 2 steps after tripping over cat States hit left frontal head and states left wrist pain  Denies LOC with fall States nausea and lightheaded this am

## 2023-08-25 NOTE — Discharge Instructions (Addendum)
Today you were seen for a mild concussion and left wrist pain.  Please pick up your prescription and take as needed.  May also take Tylenol and Motrin as needed for pain.  Please do not take Motrin for greater than 5 days in a row may cause rebound headaches.  Please follow-up with your primary care physician in 1 week for further evaluation and possible follow-up imaging.  Your left wrist x-ray is still pending radiology read.  You will be contacted at the given contact information if results warrant further treatment. Thank you for letting us treat you today. After performing a physical exam and reviewing your imaging, I feel you are safe to go home. Please follow up with your PCP in the next several days and provide them with your records from this visit. Return to the Emergency Room if pain becomes severe or symptoms worsen.
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
## 2023-08-25 NOTE — ED Notes (Signed)
Patient transported to X-ray 

## 2023-10-27 NOTE — Therapy (Deleted)
OUTPATIENT PHYSICAL THERAPY FEMALE PELVIC EVALUATION   Patient Name: Sharon Khan MRN: [id number] DOB:1991/02/23, 33 y.o., female Today's Date: 10/27/2023  END OF SESSION:   Past Medical History:  Diagnosis Date   Anemia    Vaginal Pap smear, abnormal    Past Surgical History:  Procedure Laterality Date   BREAST LUMPECTOMY  2012   Patient Active Problem List   Diagnosis Date Noted   IDA (iron deficiency anemia) 11/27/2021   Pre-eclampsia in third trimester 11/26/2021   SVD (spontaneous vaginal delivery) 11/26/2021   Thrombocytopenia affecting pregnancy (HCC) 11/25/2021    PCP: none  REFERRING PROVIDER: Barnie Mort, PA-C   REFERRING DIAG: N94.10 (ICD-10-CM) - Unspecified dyspareunia   THERAPY DIAG:  No diagnosis found.  Rationale for Evaluation and Treatment: Rehabilitation  ONSET DATE: ***  SUBJECTIVE:                                                                                                                                                                                           SUBJECTIVE STATEMENT: *** Fluid intake: {Yes/No:304960894}   PAIN:  Are you having pain? {yes/no:20286} NPRS scale: ***/10 Pain location: {pelvic pain location:27098}  Pain type: {type:313116} Pain description: {PAIN DESCRIPTION:21022940}   Aggravating factors: *** Relieving factors: ***  PRECAUTIONS: None  RED FLAGS: {PT Red Flags:29287}   WEIGHT BEARING RESTRICTIONS: No  FALLS:  Has patient fallen in last 6 months? {fallsyesno:27318}  LIVING ENVIRONMENT: Lives with: {OPRC lives with:25569::"lives with their family"} Lives in: {Lives in:25570} Stairs: {opstairs:27293} Has following equipment at home: {Assistive devices:23999}  OCCUPATION: ***  PLOF: {PLOF:24004}  PATIENT GOALS: ***  PERTINENT HISTORY:  See above. Sexual abuse: {Yes/No:304960894}  BOWEL MOVEMENT: Pain with bowel movement: {yes/no:20286} Type of bowel movement:{PT BM  type:27100} Fully empty rectum: {Yes/No:304960894} Leakage: {Yes/No:304960894} Pads: {Yes/No:304960894} Fiber supplement: {Yes/No:304960894}  URINATION: Pain with urination: {yes/no:20286} Fully empty bladder: {Yes/No:304960894} Stream: {PT urination:27102} Urgency: {Yes/No:304960894} Frequency: *** Leakage: {PT leakage:27103} Pads: {Yes/No:304960894}  INTERCOURSE: Pain with intercourse: {pain with intercourse PA:27099} Ability to have vaginal penetration:  {Yes/No:304960894} Climax: *** Marinoff Scale: ***/3  PREGNANCY: Vaginal deliveries *** Tearing {Yes***/No:304960894} C-section deliveries *** Currently pregnant {Yes***/No:304960894}  PROLAPSE: {PT prolapse:27101}   OBJECTIVE:  Note: Objective measures were completed at Evaluation unless otherwise noted.  DIAGNOSTIC FINDINGS:  ***  PATIENT SURVEYS:  {rehab surveys:24030}  PFIQ-7 ***  COGNITION: Overall cognitive status: {cognition:24006}     SENSATION: Light touch: {intact/deficits:24005} Proprioception: {intact/deficits:24005}  MUSCLE LENGTH: Hamstrings: Right *** deg; Left *** deg Thomas test: Right *** deg; Left *** deg  LUMBAR SPECIAL TESTS:  {lumbar special test:25242}  FUNCTIONAL TESTS:  {Functional tests:24029}  GAIT: Distance walked: ***  Assistive device utilized: {Assistive devices:23999} Level of assistance: {Levels of assistance:24026} Comments: ***  POSTURE: {posture:25561}  PELVIC ALIGNMENT:  LUMBARAROM/PROM:  A/PROM A/PROM  eval  Flexion   Extension   Right lateral flexion   Left lateral flexion   Right rotation   Left rotation    (Blank rows = not tested)  LOWER EXTREMITY ROM:  {AROM/PROM:27142} ROM Right eval Left eval  Hip flexion    Hip extension    Hip abduction    Hip adduction    Hip internal rotation    Hip external rotation    Knee flexion    Knee extension    Ankle dorsiflexion    Ankle plantarflexion    Ankle inversion    Ankle eversion      (Blank rows = not tested)  LOWER EXTREMITY MMT:  MMT Right eval Left eval  Hip flexion    Hip extension    Hip abduction    Hip adduction    Hip internal rotation    Hip external rotation    Knee flexion    Knee extension    Ankle dorsiflexion    Ankle plantarflexion    Ankle inversion    Ankle eversion     PALPATION:   General  ***                External Perineal Exam ***                             Internal Pelvic Floor ***  Patient confirms identification and approves PT to assess internal pelvic floor and treatment {yes/no:20286}  PELVIC MMT:   MMT eval  Vaginal   Internal Anal Sphincter   External Anal Sphincter   Puborectalis   Diastasis Recti   (Blank rows = not tested)        TONE: ***  PROLAPSE: ***  TODAY'S TREATMENT:                                                                                                                              DATE: ***  EVAL ***   PATIENT EDUCATION:  Education details: *** Person educated: {Person educated:25204} Education method: {Education Method:25205} Education comprehension: {Education Comprehension:25206}  HOME EXERCISE PROGRAM: ***  ASSESSMENT:  CLINICAL IMPRESSION: Patient is a *** y.o. *** who was seen today for physical therapy evaluation and treatment for ***.   OBJECTIVE IMPAIRMENTS: {opptimpairments:25111}.   ACTIVITY LIMITATIONS: {activitylimitations:27494}  PARTICIPATION LIMITATIONS: {participationrestrictions:25113}  PERSONAL FACTORS: {Personal factors:25162} are also affecting patient's functional outcome.   REHAB POTENTIAL: {rehabpotential:25112}  CLINICAL DECISION MAKING: {clinical decision making:25114}  EVALUATION COMPLEXITY: {Evaluation complexity:25115}   GOALS: Goals reviewed with patient? {yes/no:20286}  SHORT TERM GOALS: Target date: ***  *** Baseline: Goal status: INITIAL  2.  *** Baseline:  Goal status: INITIAL  3.  *** Baseline:  Goal status:  INITIAL  4.  *** Baseline:  Goal status:  INITIAL  5.  *** Baseline:  Goal status: INITIAL  6.  *** Baseline:  Goal status: INITIAL  LONG TERM GOALS: Target date: ***  *** Baseline:  Goal status: INITIAL  2.  *** Baseline:  Goal status: INITIAL  3.  *** Baseline:  Goal status: INITIAL  4.  *** Baseline:  Goal status: INITIAL  5.  *** Baseline:  Goal status: INITIAL  6.  *** Baseline:  Goal status: INITIAL  PLAN:  PT FREQUENCY: {rehab frequency:25116}  PT DURATION: {rehab duration:25117}  PLANNED INTERVENTIONS: {rehab planned interventions:25118::"97110-Therapeutic exercises","97530- Therapeutic [phone]- Neuromuscular re-education","97535- Self XBMW","41324- Manual therapy"}  PLAN FOR NEXT SESSION: ***   Kendallyn Lippold, PT 10/27/2023, 11:43 AM

## 2023-10-28 ENCOUNTER — Ambulatory Visit: Payer: Medicaid Other | Admitting: Physical Therapy

## 2023-11-03 NOTE — Therapy (Unsigned)
OUTPATIENT PHYSICAL THERAPY FEMALE PELVIC EVALUATION   Patient Name: Sharon Khan MRN: [id number] DOB:06-Jan-1991, 33 y.o., female Today's Date: 11/04/2023  END OF SESSION:  PT End of Session - 11/04/23 1216     Visit Number 1    Number of Visits 9    Date for PT Re-Evaluation 05/03/24    Authorization Type Medicaid    Authorization - Visit Number 1    PT Start Time 1020    PT Stop Time 1100    PT Time Calculation (min) 40 min    Activity Tolerance Patient tolerated treatment well    Behavior During Therapy [facility] for tasks assessed/performed             Past Medical History:  Diagnosis Date   Anemia    Vaginal Pap smear, abnormal    Past Surgical History:  Procedure Laterality Date   BREAST LUMPECTOMY  2012   Patient Active Problem List   Diagnosis Date Noted   IDA (iron deficiency anemia) 11/27/2021   Pre-eclampsia in third trimester 11/26/2021   SVD (spontaneous vaginal delivery) 11/26/2021   Thrombocytopenia affecting pregnancy (HCC) 11/25/2021    PCP: none  REFERRING PROVIDER: Barnie Mort, PA-C   REFERRING DIAG: N94.10 (ICD-10-CM) - Unspecified dyspareunia   THERAPY DIAG:  Muscle weakness (generalized)  Unspecified lack of coordination  Stress incontinence (female) (female)  Rationale for Evaluation and Treatment: Rehabilitation  ONSET DATE: 2007  SUBJECTIVE:                                                                                                                                                                                           SUBJECTIVE STATEMENT: Pt reports to PT with pain during intercourse. Pt reports that position affects her pain, and she has less pain when she is in control of the depth of penetration. Pt reports that her personal moisture levels are good and she has no issue with lubrication. She reports low back pain/soreness that has been present for a long time, intermittent in nature. Fluid intake: water   PAIN:  Are  you having pain? Yes NPRS scale: 5-6/10 Pain location: Internal and Deep  Pain type: aching and sharp Pain description: intermittent   Aggravating factors: vaginal intercourse, position dependent  Relieving factors: positions where she is in control of depth   PRECAUTIONS: None  RED FLAGS: None   WEIGHT BEARING RESTRICTIONS: No  FALLS:  Has patient fallen in last 6 months? No  LIVING ENVIRONMENT: Lives with: lives with their family Lives in: House/apartment  OCCUPATION: stay at home mom   PLOF: Independent  PATIENT GOALS: decrease pain during intercourse, increase  lumbopelvic strength   PERTINENT HISTORY:  Breast Lumpectomy Sexual abuse: No  BOWEL MOVEMENT: Pain with bowel movement: Yes Type of bowel movement:Type (Bristol Stool Scale) 4, Frequency every few days (has always been the case), Strain Yes, and Splinting no Fully empty rectum: yes Leakage: No Pads: No Fiber supplement: Yes: iron pills   URINATION: Pain with urination: No Fully empty bladder: yes Stream: Strong Urgency: No Frequency: WNL Leakage: Coughing, Sneezing, Laughing, and Exercise Pads: No  INTERCOURSE: Pain with intercourse: During Penetration and Pain Interrupts Intercourse Ability to have vaginal penetration:  yes Climax: yes Marinoff Scale: 2/3  PREGNANCY: Vaginal deliveries 1  Tearing Yes: 2nd degree Currently pregnant No  PROLAPSE: None   OBJECTIVE:  Note: Objective measures were completed at Evaluation unless otherwise noted.  DIAGNOSTIC FINDINGS:  N/A  PATIENT SURVEYS:  PFIQ-7: 14 POPIQ-7: 14  COGNITION: Overall cognitive status: Within functional limits for tasks assessed     SENSATION: Light touch: Appears intact Proprioception: Appears intact  LUMBAR SPECIAL TESTS:  Single leg stance test: Positive  FUNCTIONAL TESTS:  Squat: mild dynamic knee valgus with sit to stand transfer  GAIT: Mild trendelenburg gait pattern with ambulation  POSTURE: No  Significant postural limitations  PELVIC ALIGNMENT: WNL  LUMBARAROM/PROM:  A/PROM A/PROM  eval  Flexion WNL  Extension WNL  Right lateral flexion 25% limited  Left lateral flexion 25% limited  Right rotation 25% limited  Left rotation 25% limited   (Blank rows = not tested)  LOWER EXTREMITY ROM: WNL  LOWER EXTREMITY MMT: WNL unless stated otherwise   MMT Right eval Left eval  Hip flexion 5/5 5/5  Hip extension 4/5 4/5  Hip abduction 4/5 4/5  Hip adduction 4/5 4/5   PALPATION:   General  no tenderness to palpation in adductors/pubic bone/perineum                External Perineal Exam: no significant dryness or muscle tension noted                             Internal Pelvic Floor: mild overactivity noted in deep pelvic floor bilaterally, no pain with palpation of deep or superficial musculature, unable to fully lengthen pelvic floor musculature   Patient confirms identification and approves PT to assess internal pelvic floor and treatment Yes  PELVIC MMT:   MMT eval  Vaginal 4/5, 10 quick flicks, 10 second hold  Diastasis Recti WNL  (Blank rows = not tested)       TONE: Mild muscle overactivity in deep pelvic floor bilaterally   PROLAPSE: Anterior vaginal wall laxity with cough test    TODAY'S TREATMENT:                                                                                                                              DATE:  11/04/2023  EVAL  Hooklying diaphragmatic breathing with emphasis on pelvic floor lengthening  during inhalation Child's pose + diaphragmatic breathing for pelvic floor relaxation and lumbopelvic stretching   PATIENT EDUCATION:  Education details: lubricant use, position dependent pain during intercourse, pelvic floor AROM, Access Code: [email or web address] Person educated: Patient Education method: Explanation, Demonstration, Tactile cues, Verbal cues, and Handouts Education comprehension: verbalized understanding, returned demonstration,  verbal cues required, and tactile cues required  HOME EXERCISE PROGRAM: Access Code: [email or web address] URL: [email or web address] Date: 11/04/2023 Prepared by: Earna Coder  Exercises - Supine Diaphragmatic Breathing  - 1 x daily - 7 x weekly - 3 sets - 10 reps - Child's Pose Stretch  - 1 x daily - 7 x weekly - 2 sets - hold  ASSESSMENT:  CLINICAL IMPRESSION: Patient is a 33 y.o. female who was seen today for physical therapy evaluation and treatment for deep pelvic pain during intercourse (5-6/10). Examination findings reveal mild lumbopelvic stiffness with 25% range of motion limitation, hip weakness (4/5 in bilateral abductors, adductors, extensors), and lack of coordination in the pelvic floor musculature. Patient's pelvic floor range of motion is limited and slight weakness (4/5) was noted throughout; no pain with today's internal examination. With internal cueing from PT, pt was able to actively lengthen her pelvic floor with diaphragmatic breathing interventions. Overall, patient tolerated treatment well and will benefit from skilled intervention to address pelvic floor coordination, control, strength, and urinary incontinence.  OBJECTIVE IMPAIRMENTS: decreased coordination, decreased endurance, decreased mobility, decreased ROM, decreased strength, and pain.   ACTIVITY LIMITATIONS: continence  PARTICIPATION LIMITATIONS:  vaginal penetration  PERSONAL FACTORS: Past/current experiences and Time since onset of injury/illness/exacerbation are also affecting patient's functional outcome.   REHAB POTENTIAL: Good  CLINICAL DECISION MAKING: Stable/uncomplicated  EVALUATION COMPLEXITY: Low   GOALS: Goals reviewed with patient? Yes  SHORT TERM GOALS: Target date: 12/02/23 Patient to report independence with HEP to decrease pelvic pain and improve quality of life. Baseline: not instructed yet Goal status: INITIAL  2.  Patient to demonstrate decreased POPIQ-7 score of < or  = to 7 points to suggest decreased functional limitations secondary to pelvic pain to improve quality of life.  Baseline: 14 points  Goal status: INITIAL   LONG TERM GOALS: Target date: 05/03/24  Patient to report independence with advanced HEP to decrease pelvic pain and improve quality of life.  Baseline: not instructed yet Goal status: INITIAL  2.  Patient will report little to no (1-2/10) pain with penile penetration vaginally to improve QOL. Baseline: pain level 10/10 Goal status: INITIAL  3.  Patient will demonstrate full pelvic floor muscle A/ROM to decrease urinary leakage with sneezing/coughing/jumping and improve quality of life.  Baseline: 50% limited  Goal status: INITIAL  4.  Patient will report little to no (1-2/10) low back pain at rest to improve QOL and allow patient to complete ADLs/work at home.  Baseline: pain level 10/10 Goal status: INITIAL  PLAN:  PT FREQUENCY: 1x/week  PT DURATION: 6 months  PLANNED INTERVENTIONS: 97110-Therapeutic exercises, 97530- Therapeutic activity, 97112- Neuromuscular re-education, 97535- Self Care, [id number]- Manual therapy, Taping, Dry Needling, Scar mobilization, Cryotherapy, and Moist heat  PLAN FOR NEXT SESSION: dry needling for back, internal manual muscle release, introduce activation of pelvic floor with breathing, introduce hip strengthening, continue lumbopelvic mobility   Earna Coder, PT, DPT 11/04/23 5:04 PM

## 2023-11-04 ENCOUNTER — Other Ambulatory Visit: Payer: Self-pay

## 2023-11-04 ENCOUNTER — Ambulatory Visit: Payer: Medicaid Other | Attending: Physician Assistant | Admitting: Physical Therapy

## 2023-11-04 ENCOUNTER — Encounter: Payer: Self-pay | Admitting: Physical Therapy

## 2023-11-04 DIAGNOSIS — M6281 Muscle weakness (generalized): Secondary | ICD-10-CM | POA: Insufficient documentation

## 2023-11-04 DIAGNOSIS — R279 Unspecified lack of coordination: Secondary | ICD-10-CM | POA: Diagnosis present

## 2023-11-04 DIAGNOSIS — N393 Stress incontinence (female) (male): Secondary | ICD-10-CM | POA: Insufficient documentation

## 2023-11-11 ENCOUNTER — Encounter: Payer: Self-pay | Admitting: Physical Therapy

## 2023-11-11 ENCOUNTER — Ambulatory Visit: Payer: Medicaid Other | Attending: Physician Assistant | Admitting: Physical Therapy

## 2023-11-11 DIAGNOSIS — N393 Stress incontinence (female) (male): Secondary | ICD-10-CM | POA: Insufficient documentation

## 2023-11-11 DIAGNOSIS — M6281 Muscle weakness (generalized): Secondary | ICD-10-CM | POA: Diagnosis present

## 2023-11-11 DIAGNOSIS — R279 Unspecified lack of coordination: Secondary | ICD-10-CM | POA: Diagnosis present

## 2023-11-11 NOTE — Patient Instructions (Signed)

## 2023-11-11 NOTE — Therapy (Signed)
 OUTPATIENT PHYSICAL THERAPY FEMALE PELVIC TREATMENT   Patient Name: Sharon Khan MRN: [id number] DOB:[birth year], 33 y.o., female Today's Date: 11/11/2023  END OF SESSION:  PT End of Session - 11/11/23 1020     Visit Number 2    Date for PT Re-Evaluation 05/03/24    Authorization Type Medicaid    Authorization Time Period 11/04/23-01/03/24    Authorization - Number of Visits 1    Progress Note Due on Visit 4    PT Start Time 1015    PT Stop Time 1055    PT Time Calculation (min) 40 min    Activity Tolerance Patient tolerated treatment well    Behavior During Therapy [facility] for tasks assessed/performed             Past Medical History:  Diagnosis Date   Anemia    Vaginal Pap smear, abnormal    Past Surgical History:  Procedure Laterality Date   BREAST LUMPECTOMY  2012   Patient Active Problem List   Diagnosis Date Noted   IDA (iron deficiency anemia) 11/27/2021   Pre-eclampsia in third trimester 11/26/2021   SVD (spontaneous vaginal delivery) 11/26/2021   Thrombocytopenia affecting pregnancy (HCC) 11/25/2021    PCP: none  REFERRING PROVIDER: Leonce Carola PARAS, PA-C   REFERRING DIAG: N94.10 (ICD-10-CM) - Unspecified dyspareunia   THERAPY DIAG:  Muscle weakness (generalized)  Unspecified lack of coordination  Stress incontinence (female) (female)  Rationale for Evaluation and Treatment: Rehabilitation  ONSET DATE: 2007  SUBJECTIVE:                                                                                                                                                                                           SUBJECTIVE STATEMENT: I have been doing the exercises.  Fluid intake: water   PAIN:  Are you having pain? Yes NPRS scale: 5-6/10 Pain location: Internal and Deep  Pain type: aching and sharp Pain description: intermittent   Aggravating factors: vaginal intercourse, position dependent  Relieving factors: positions where she is in control of  depth   PRECAUTIONS: None  RED FLAGS: None   WEIGHT BEARING RESTRICTIONS: No  FALLS:  Has patient fallen in last 6 months? No  LIVING ENVIRONMENT: Lives with: lives with their family Lives in: House/apartment  OCCUPATION: stay at home mom   PLOF: Independent  PATIENT GOALS: decrease pain during intercourse, increase lumbopelvic strength   PERTINENT HISTORY:  Breast Lumpectomy Sexual abuse: No  BOWEL MOVEMENT: Pain with bowel movement: Yes Type of bowel movement:Type (Bristol Stool Scale) 4, Frequency every few days (has always been the case), Strain Yes, and Splinting no Fully empty rectum:  yes Leakage: No Pads: No Fiber supplement: Yes: iron pills   URINATION: Pain with urination: No Fully empty bladder: yes Stream: Strong Urgency: No Frequency: WNL Leakage: Coughing, Sneezing, Laughing, and Exercise Pads: No  INTERCOURSE: Pain with intercourse: During Penetration and Pain Interrupts Intercourse Ability to have vaginal penetration:  yes Climax: yes Marinoff Scale: 2/3  PREGNANCY: Vaginal deliveries 1  Tearing Yes: 2nd degree Currently pregnant No  PROLAPSE: None   OBJECTIVE:  Note: Objective measures were completed at Evaluation unless otherwise noted.  DIAGNOSTIC FINDINGS:  N/A  PATIENT SURVEYS:  PFIQ-7: 14 POPIQ-7: 14  COGNITION: Overall cognitive status: Within functional limits for tasks assessed     SENSATION: Light touch: Appears intact Proprioception: Appears intact  LUMBAR SPECIAL TESTS:  Single leg stance test: Positive  FUNCTIONAL TESTS:  Squat: mild dynamic knee valgus with sit to stand transfer  GAIT: Mild trendelenburg gait pattern with ambulation  POSTURE: No Significant postural limitations  PELVIC ALIGNMENT: WNL  LUMBARAROM/PROM:  A/PROM A/PROM  eval  Flexion WNL  Extension WNL  Right lateral flexion 25% limited  Left lateral flexion 25% limited  Right rotation 25% limited  Left rotation 25% limited    (Blank rows = not tested)  LOWER EXTREMITY ROM: WNL  LOWER EXTREMITY MMT: WNL unless stated otherwise   MMT Right eval Left eval  Hip flexion 5/5 5/5  Hip extension 4/5 4/5  Hip abduction 4/5 4/5  Hip adduction 4/5 4/5   PALPATION:   General  no tenderness to palpation in adductors/pubic bone/perineum                External Perineal Exam: no significant dryness or muscle tension noted                             Internal Pelvic Floor: mild overactivity noted in deep pelvic floor bilaterally, no pain with palpation of deep or superficial musculature, unable to fully lengthen pelvic floor musculature   Patient confirms identification and approves PT to assess internal pelvic floor and treatment Yes  PELVIC MMT:   MMT eval  Vaginal 4/5, 10 quick flicks, 10 second hold  Diastasis Recti WNL  (Blank rows = not tested)       TONE: Mild muscle overactivity in deep pelvic floor bilaterally   PROLAPSE: Anterior vaginal wall laxity with cough test    TODAY'S TREATMENT:     11/11/23 Manual: Soft tissue mobilization: To assess for dry needling Manual work to the lumbar paraspinals to elongate after dry needling Quadruped pulling the tissue to the abdomen  Quadruped with pulling up on the pubic bone tissue to elongate as patient moves back and forth Quadruped placing hands into the diaphragm and elongate the area as patient breaths Trigger Point Dry Needling  Initial Treatment: Pt instructed on Dry Needling rational, procedures, and possible side effects. Pt instructed to expect mild to moderate muscle soreness later in the day and/or into the next day.  Pt instructed in methods to reduce muscle soreness. Pt instructed to continue prescribed HEP. Because Dry Needling was performed over or adjacent to a lung field, pt was educated on S/S of pneumothorax and to seek immediate medical attention should they occur.  Patient was educated on signs and symptoms of infection and other  risk factors and advised to seek medical attention should they occur.  Patient verbalized understanding of these instructions and education.   Patient Verbal Consent Given: Yes Education Handout Provided:  Yes Muscles Treated: lumbar multifidi Electrical Stimulation Performed: No Treatment Response/Outcome: elongation of tissue and trigger point response Exercises: Stretches/mobility: Hip flexor stretch in half kneeling and lateral trunk sidebend Open book with breath to open up the lower rib cage.  Strengthening: Transverse abdominus with contraction of the abdomen and using her hands to assist the lower rib cage downward Hip flexion isometric with core engagement Supine alternate shoulder flexion with core engagement Bridges                                                                                                                              DATE:  11/04/2023  EVAL  Hooklying diaphragmatic breathing with emphasis on pelvic floor lengthening during inhalation Child's pose + diaphragmatic breathing for pelvic floor relaxation and lumbopelvic stretching   PATIENT EDUCATION:  11/11/23 Education details: Access Code: [email or web address], information on dry needling Person educated: Patient Education method: Explanation, Demonstration, Tactile cues, Verbal cues, and Handouts Education comprehension: verbalized understanding, returned demonstration, verbal cues required, and tactile cues required  HOME EXERCISE PROGRAM: 11/11/23 Access Code: [email or web address] URL: [email or web address] Date: 11/11/2023 Prepared by: Channing Pereyra  Exercises - Supine Diaphragmatic Breathing  - 1 x daily - 7 x weekly - 3 sets - 10 reps - Child's Pose Stretch  - 1 x daily - 7 x weekly - 2 sets - hold - Sidelying Thoracic Rotation with Open Book  - 1 x daily - 7 x weekly - 1 sets - 10 reps - Half Kneeling Hip Flexor Stretch with Sidebend  - 1 x daily - 7 x weekly - 1 sets - 1 reps - 30 sec hold -  Hooklying Transversus Abdominis Palpation  - 1 x daily - 7 x weekly - 1 sets - 10 reps - Hooklying Isometric Hip Flexion  - 1 x daily - 7 x weekly - 1 sets - 10 reps  ASSESSMENT:  CLINICAL IMPRESSION: Patient is a 33 y.o. female who was seen today for physical therapy  treatment for deep pelvic pain during intercourse (5-6/10). Patient responded well with dry needling. She had no back pain after manual work. She is learning how to engage her core working the upper and lower abdominals equally.  Overall, patient tolerated treatment well and will benefit from skilled intervention to address pelvic floor coordination, control, strength, and urinary incontinence.  OBJECTIVE IMPAIRMENTS: decreased coordination, decreased endurance, decreased mobility, decreased ROM, decreased strength, and pain.   ACTIVITY LIMITATIONS: continence  PARTICIPATION LIMITATIONS:  vaginal penetration  PERSONAL FACTORS: Past/current experiences and Time since onset of injury/illness/exacerbation are also affecting patient's functional outcome.   REHAB POTENTIAL: Good  CLINICAL DECISION MAKING: Stable/uncomplicated  EVALUATION COMPLEXITY: Low   GOALS: Goals reviewed with patient? Yes  SHORT TERM GOALS: Target date: 12/02/23 Patient to report independence with HEP to decrease pelvic pain and improve quality of life. Baseline: not instructed yet Goal status: INITIAL  2.  Patient to demonstrate decreased POPIQ-7 score of < or =  to 7 points to suggest decreased functional limitations secondary to pelvic pain to improve quality of life.  Baseline: 14 points  Goal status: INITIAL   LONG TERM GOALS: Target date: 05/03/24  Patient to report independence with advanced HEP to decrease pelvic pain and improve quality of life.  Baseline: not instructed yet Goal status: INITIAL  2.  Patient will report little to no (1-2/10) pain with penile penetration vaginally to improve QOL. Baseline: pain level 10/10 Goal status:  INITIAL  3.  Patient will demonstrate full pelvic floor muscle A/ROM to decrease urinary leakage with sneezing/coughing/jumping and improve quality of life.  Baseline: 50% limited  Goal status: INITIAL  4.  Patient will report little to no (1-2/10) low back pain at rest to improve QOL and allow patient to complete ADLs/work at home.  Baseline: pain level 10/10 Goal status: INITIAL  PLAN:  PT FREQUENCY: 1x/week  PT DURATION: 6 months  PLANNED INTERVENTIONS: 97110-Therapeutic exercises, 97530- Therapeutic activity, 97112- Neuromuscular re-education, 97535- Self Care, [id number]- Manual therapy, Taping, Dry Needling, Scar mobilization, Cryotherapy, and Moist heat  PLAN FOR NEXT SESSION: dry needling for back if needed, internal manual muscle release, introduce activation of pelvic floor with breathing, introduce hip strengthening, continue lumbopelvic mobility   Channing Pereyra, PT 11/11/23 10:58 AM

## 2023-11-18 ENCOUNTER — Encounter: Payer: Self-pay | Admitting: Physical Therapy

## 2023-11-18 ENCOUNTER — Ambulatory Visit: Payer: Medicaid Other | Admitting: Physical Therapy

## 2023-11-18 DIAGNOSIS — N393 Stress incontinence (female) (male): Secondary | ICD-10-CM

## 2023-11-18 DIAGNOSIS — M6281 Muscle weakness (generalized): Secondary | ICD-10-CM | POA: Diagnosis not present

## 2023-11-18 DIAGNOSIS — R279 Unspecified lack of coordination: Secondary | ICD-10-CM
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
## 2023-11-25 ENCOUNTER — Ambulatory Visit: Payer: Medicaid Other | Admitting: Physical Therapy

## 2023-11-25 ENCOUNTER — Encounter: Payer: Self-pay | Admitting: Physical Therapy

## 2023-11-25 DIAGNOSIS — M6281 Muscle weakness (generalized): Secondary | ICD-10-CM

## 2023-11-25 DIAGNOSIS — N393 Stress incontinence (female) (male): Secondary | ICD-10-CM

## 2023-11-25 DIAGNOSIS — R279 Unspecified lack of coordination: Secondary | ICD-10-CM

## 2023-11-25 NOTE — Therapy (Signed)
OUTPATIENT PHYSICAL THERAPY FEMALE PELVIC TREATMENT   Patient Name: Sharon Khan MRN: [id number] DOB:1990/10/23, 33 y.o., female Today's Date: 11/25/2023  END OF SESSION:  PT End of Session - 11/25/23 1021     Visit Number 4    Date for PT Re-Evaluation 05/03/24    Authorization Type Medicaid    Authorization Time Period 11/04/23-01/03/24    Authorization - Visit Number 3    Authorization - Number of Visits 4    PT Start Time 1021    PT Stop Time 1100    PT Time Calculation (min) 39 min    Activity Tolerance Patient tolerated treatment well    Behavior During Therapy [facility] for tasks assessed/performed             Past Medical History:  Diagnosis Date   Anemia    Vaginal Pap smear, abnormal    Past Surgical History:  Procedure Laterality Date   BREAST LUMPECTOMY  2012   Patient Active Problem List   Diagnosis Date Noted   IDA (iron deficiency anemia) 11/27/2021   Pre-eclampsia in third trimester 11/26/2021   SVD (spontaneous vaginal delivery) 11/26/2021   Thrombocytopenia affecting pregnancy (HCC) 11/25/2021    PCP: none  REFERRING PROVIDER: Barnie Mort, PA-C   REFERRING DIAG: N94.10 (ICD-10-CM) - Unspecified dyspareunia   THERAPY DIAG:  Muscle weakness (generalized)  Unspecified lack of coordination  Stress incontinence (female) (female)  Rationale for Evaluation and Treatment: Rehabilitation  ONSET DATE: 2007  SUBJECTIVE:                                                                                                                                                                                           SUBJECTIVE STATEMENT: Back pain is 60% better.  Fluid intake: water  PAIN:  Are you having pain? Yes: NPRS scale: 6 Pain location: low back to the low thoracic Pain description: aching shooting Aggravating factors: constant Relieving factors: stretch  PAIN:  Are you having pain? Yes NPRS scale: 5-6/10 Pain location: Internal and  Deep  Pain type: aching and sharp Pain description: intermittent   Aggravating factors: vaginal intercourse, position dependent  Relieving factors: positions where she is in control of depth   PRECAUTIONS: None  RED FLAGS: None   WEIGHT BEARING RESTRICTIONS: No  FALLS:  Has patient fallen in last 6 months? No  LIVING ENVIRONMENT: Lives with: lives with their family Lives in: House/apartment  OCCUPATION: stay at home mom   PLOF: Independent  PATIENT GOALS: decrease pain during intercourse, increase lumbopelvic strength   PERTINENT HISTORY:  Breast Lumpectomy Sexual abuse: No  BOWEL MOVEMENT: Pain with bowel movement:  Yes Type of bowel movement:Type (Bristol Stool Scale) 4, Frequency every few days (has always been the case), Strain Yes, and Splinting no Fully empty rectum: yes Leakage: No Pads: No Fiber supplement: Yes: iron pills   URINATION: Pain with urination: No Fully empty bladder: yes Stream: Strong Urgency: No Frequency: WNL Leakage: Coughing, Sneezing, Laughing, and Exercise Pads: No  INTERCOURSE: Pain with intercourse: During Penetration and Pain Interrupts Intercourse Ability to have vaginal penetration:  yes Climax: yes Marinoff Scale: 2/3  PREGNANCY: Vaginal deliveries 1  Tearing Yes: 2nd degree Currently pregnant No  PROLAPSE: None   OBJECTIVE:  Note: Objective measures were completed at Evaluation unless otherwise noted.  DIAGNOSTIC FINDINGS:  N/A  PATIENT SURVEYS:  PFIQ-7: 14 POPIQ-7: 14  COGNITION: Overall cognitive status: Within functional limits for tasks assessed     SENSATION: Light touch: Appears intact Proprioception: Appears intact  LUMBAR SPECIAL TESTS:  Single leg stance test: Positive  FUNCTIONAL TESTS:  Squat: mild dynamic knee valgus with sit to stand transfer  GAIT: Mild trendelenburg gait pattern with ambulation  POSTURE: No Significant postural limitations  PELVIC ALIGNMENT:  WNL  LUMBARAROM/PROM:  A/PROM A/PROM  eval  Flexion WNL  Extension WNL  Right lateral flexion 25% limited  Left lateral flexion 25% limited  Right rotation 25% limited  Left rotation 25% limited   (Blank rows = not tested)  LOWER EXTREMITY ROM: WNL  LOWER EXTREMITY MMT: WNL unless stated otherwise   MMT Right eval Left eval  Hip flexion 5/5 5/5  Hip extension 4/5 4/5  Hip abduction 4/5 4/5  Hip adduction 4/5 4/5   PALPATION:   General  no tenderness to palpation in adductors/pubic bone/perineum                External Perineal Exam: no significant dryness or muscle tension noted                             Internal Pelvic Floor: mild overactivity noted in deep pelvic floor bilaterally, no pain with palpation of deep or superficial musculature, unable to fully lengthen pelvic floor musculature   Patient confirms identification and approves PT to assess internal pelvic floor and treatment Yes  PELVIC MMT:   MMT eval 11/25/23  Vaginal 4/5, 10 quick flicks, 10 second hold 4/5  Diastasis Recti WNL   (Blank rows = not tested)       TONE: Mild muscle overactivity in deep pelvic floor bilaterally   PROLAPSE: Anterior vaginal wall laxity with cough test    TODAY'S TREATMENT:     11/25/23 Manual: Myofascial release: Release of the urogenital diaphragm gong through the different layers Spinal mobilization: Internal pelvic floor techniques: No emotional/communication barriers or cognitive limitation. Patient is motivated to learn. Patient understands and agrees with treatment goals and plan. PT explains patient will be examined in standing, sitting, and lying down to see how their muscles and joints work. When they are ready, they will be asked to remove their underwear so PT can examine their perineum. The patient is also given the option of providing their own chaperone as one is not provided in our facility. The patient also has the right and is explained the right to  defer or refuse any part of the evaluation or treatment including the internal exam. With the patient's consent, PT will use one gloved finger to gently assess the muscles of the pelvic floor, seeing how well it contracts and relaxes and  if there is muscle symmetry. After, the patient will get dressed and PT and patient will discuss exam findings and plan of care. PT and patient discuss plan of care, schedule, attendance policy and HEP activities.  Going through the vaginal canal working on the levator ani then working on releasing around the cervix, fallopian tube, and sides of the bladder Exercises: Stretches/mobility: Piriformis stretch holding 30 sec sitting Sitting hip adductor stretch holding 30 sec Sit like a z and lift the leg into internal rotation then lift hips in the air Hip adductor stretch in squat position with rotating foot in and out then rocking hips forward and back     11/18/23 Manual: Soft tissue mobilization: To assess for dry needling Manual work to the lumbar paraspinals to elongate after dry needling Spinal mobilization: PA and rotational mobilization to the thoracic lumbar spine Trigger Point Dry Needling  Subsequent Treatment: Instructions provided previously at initial dry needling treatment.  Instructions reviewed, if requested by the patient, prior to subsequent dry needling treatment.   Patient Verbal Consent Given: Yes Education Handout Provided: Previously Provided Muscles Treated: lumbar multifidi Electrical Stimulation Performed: No Treatment Response/Outcome: elongation of tissue and trigger point response Neuromuscular re-education: Down training: Sitting in squat position on foam roll with diaphragmatic breathing to relax the pelvic floor Sit on foam roll to massage the pelvic going side to side Sit on foam roll to massage the piriformis Exercises: Stretches/mobility: Supine on foam roller to increase thoracic and lumbar extension Pigeon pose  holding 30 sec bil.  Pulling leg across trunk to stretch posterior hip holding 30 sec bil.  Sit on foam roll to massage the pelvic going side to side    11/11/23 Manual: Soft tissue mobilization: To assess for dry needling Manual work to the lumbar paraspinals to elongate after dry needling Quadruped pulling the tissue to the abdomen  Quadruped with pulling up on the pubic bone tissue to elongate as patient moves back and forth Quadruped placing hands into the diaphragm and elongate the area as patient breaths Trigger Point Dry Needling  Initial Treatment: Pt instructed on Dry Needling rational, procedures, and possible side effects. Pt instructed to expect mild to moderate muscle soreness later in the day and/or into the next day.  Pt instructed in methods to reduce muscle soreness. Pt instructed to continue prescribed HEP. Because Dry Needling was performed over or adjacent to a lung field, pt was educated on S/S of pneumothorax and to seek immediate medical attention should they occur.  Patient was educated on signs and symptoms of infection and other risk factors and advised to seek medical attention should they occur.  Patient verbalized understanding of these instructions and education.   Patient Verbal Consent Given: Yes Education Handout Provided: Yes Muscles Treated: lumbar multifidi Electrical Stimulation Performed: No Treatment Response/Outcome: elongation of tissue and trigger point response Exercises: Stretches/mobility: Hip flexor stretch in half kneeling and lateral trunk sidebend Open book with breath to open up the lower rib cage.  Strengthening: Transverse abdominus with contraction of the abdomen and using her hands to assist the lower rib cage downward Hip flexion isometric with core engagement Supine alternate shoulder flexion with core engagement Henreitta Leber  DATE:  11/04/2023  EVAL  Hooklying diaphragmatic breathing with emphasis on pelvic floor lengthening during inhalation Child's pose + diaphragmatic breathing for pelvic floor relaxation and lumbopelvic stretching   PATIENT EDUCATION:  11/18/23 Education details: Access Code: [email or web address], information on dry needling Person educated: Patient Education method: Explanation, Demonstration, Tactile cues, Verbal cues, and Handouts Education comprehension: verbalized understanding, returned demonstration, verbal cues required, and tactile cues required  HOME EXERCISE PROGRAM: 11/18/23 Access Code: [email or web address] URL: [email or web address] Date: 11/18/2023 Prepared by: Eulis Foster  Exercises - Supine Diaphragmatic Breathing  - 1 x daily - 7 x weekly - 3 sets - 10 reps - Child's Pose Stretch  - 1 x daily - 7 x weekly - 2 sets - hold - Sidelying Thoracic Rotation with Open Book  - 1 x daily - 7 x weekly - 1 sets - 10 reps - Half Kneeling Hip Flexor Stretch with Sidebend  - 1 x daily - 7 x weekly - 1 sets - 1 reps - 30 sec hold - Hooklying Transversus Abdominis Palpation  - 1 x daily - 7 x weekly - 1 sets - 10 reps - Hooklying Isometric Hip Flexion  - 1 x daily - 7 x weekly - 1 sets - 10 reps - Piriformis Mobilization on Foam Roll  - 1 x daily - 7 x weekly - 3 sets - 10 reps - Thoracic Stretch on Foam Roll - Hands Clasped  - 1 x daily - 7 x weekly - 3 sets - 10 reps  ASSESSMENT:  CLINICAL IMPRESSION: Patient is a 33 y.o. female who was seen today for physical therapy  treatment for deep pelvic pain during intercourse (5-6/10). Patient reports her back pain is 60% better. She has increased mobility of the cervix, fallopian tube and sides of the bladder after the manual work. She did not have pain at the end of the session. She will only leak with jumping.  Overall, patient tolerated treatment well and will benefit from skilled intervention to address pelvic floor  coordination, control, strength, and urinary incontinence.  OBJECTIVE IMPAIRMENTS: decreased coordination, decreased endurance, decreased mobility, decreased ROM, decreased strength, and pain.   ACTIVITY LIMITATIONS: continence  PARTICIPATION LIMITATIONS:  vaginal penetration  PERSONAL FACTORS: Past/current experiences and Time since onset of injury/illness/exacerbation are also affecting patient's functional outcome.   REHAB POTENTIAL: Good  CLINICAL DECISION MAKING: Stable/uncomplicated  EVALUATION COMPLEXITY: Low   GOALS: Goals reviewed with patient? Yes  SHORT TERM GOALS: Target date: 12/02/23 Patient to report independence with HEP to decrease pelvic pain and improve quality of life. Baseline: not instructed yet Goal status: Met 11/17/13  2.  Patient to demonstrate decreased POPIQ-7 score of < or = to 7 points to suggest decreased functional limitations secondary to pelvic pain to improve quality of life.  Baseline: 14 points  Goal status: INITIAL   LONG TERM GOALS: Target date: 05/03/24  Patient to report independence with advanced HEP to decrease pelvic pain and improve quality of life.  Baseline: not instructed yet Goal status: INITIAL  2.  Patient will report little to no (1-2/10) pain with penile penetration vaginally to improve QOL. Baseline: pain level 10/10 Goal status: INITIAL  3.  Patient will demonstrate full pelvic floor muscle A/ROM to decrease urinary leakage with sneezing/coughing/jumping and improve quality of life.  Baseline: 50% limited  Goal status: INITIAL  4.  Patient will report little to no (1-2/10) low back pain at rest to improve QOL and allow patient to complete ADLs/work at home.  Baseline: pain level 10/10 Goal status: INITIAL  PLAN:  PT FREQUENCY: 1x/week  PT DURATION: 6 months  PLANNED INTERVENTIONS: 97110-Therapeutic exercises, 97530- Therapeutic activity, 97112- Neuromuscular re-education, 97535- Self Care, [id number]- Manual therapy,  Taping, Dry Needling, Scar mobilization, Cryotherapy, and Moist heat  PLAN FOR NEXT SESSION: dry needling for back and deep pelvic floor muscles if needed, internal manual muscle release, introduce activation of pelvic floor with breathing, introduce hip strengthening, continue lumbopelvic mobility   Eulis Foster, PT 11/25/23 11:00 AM

## 2023-12-02 ENCOUNTER — Ambulatory Visit: Payer: Medicaid Other | Admitting: Physical Therapy

## 2023-12-02 ENCOUNTER — Telehealth: Payer: Self-pay | Admitting: Physical Therapy

## 2023-12-09 ENCOUNTER — Ambulatory Visit: Payer: Medicaid Other | Attending: Physician Assistant | Admitting: Physical Therapy

## 2023-12-09 DIAGNOSIS — M6281 Muscle weakness (generalized): Secondary | ICD-10-CM | POA: Insufficient documentation

## 2023-12-09 DIAGNOSIS — N393 Stress incontinence (female) (male): Secondary | ICD-10-CM | POA: Diagnosis present

## 2023-12-09 DIAGNOSIS — R279 Unspecified lack of coordination: Secondary | ICD-10-CM | POA: Insufficient documentation

## 2023-12-09 NOTE — Therapy (Signed)
 OUTPATIENT PHYSICAL THERAPY FEMALE PELVIC TREATMENT   Patient Name: Sharon Khan MRN: [id number] DOB:07-Jan-1991, 33 y.o., female Today's Date: 12/09/2023  END OF SESSION:  PT End of Session - 12/09/23 1019     Visit Number 5    Date for PT Re-Evaluation 05/03/24    Authorization Type Medicaid    Authorization Time Period 11/04/23-01/03/24    Authorization - Number of Visits 4    PT Start Time 1019    PT Stop Time 1058    PT Time Calculation (min) 39 min    Activity Tolerance Patient tolerated treatment well    Behavior During Therapy [facility] for tasks assessed/performed             Past Medical History:  Diagnosis Date   Anemia    Vaginal Pap smear, abnormal    Past Surgical History:  Procedure Laterality Date   BREAST LUMPECTOMY  2012   Patient Active Problem List   Diagnosis Date Noted   IDA (iron deficiency anemia) 11/27/2021   Pre-eclampsia in third trimester 11/26/2021   SVD (spontaneous vaginal delivery) 11/26/2021   Thrombocytopenia affecting pregnancy (HCC) 11/25/2021    PCP: none  REFERRING PROVIDER: Barnie Mort, PA-C   REFERRING DIAG: N94.10 (ICD-10-CM) - Unspecified dyspareunia   THERAPY DIAG:  No diagnosis found.  Rationale for Evaluation and Treatment: Rehabilitation  ONSET DATE: 2007  SUBJECTIVE:                                                                                                                                                                                           SUBJECTIVE STATEMENT: Patient reports that she is having back pain with 7/10 back pain bilaterally in the thoracolumbar region. Stretches have been going well, her baby has been sick so she wasn't able to make it last visit. She has only noticed leakage when she laughs really hard - no other instances of leakage.  Fluid intake: water  PAIN:  Are you having pain? Yes: NPRS scale: 6 Pain location: low back to the low thoracic Pain description: aching  shooting Aggravating factors: constant Relieving factors: stretch  PAIN:  Are you having pain? Yes NPRS scale: 5-6/10 Pain location: Internal and Deep  Pain type: aching and sharp Pain description: intermittent   Aggravating factors: vaginal intercourse, position dependent  Relieving factors: positions where she is in control of depth   PRECAUTIONS: None  RED FLAGS: None   WEIGHT BEARING RESTRICTIONS: No  FALLS:  Has patient fallen in last 6 months? No  LIVING ENVIRONMENT: Lives with: lives with their family Lives in: House/apartment  OCCUPATION: stay at home mom  PLOF: Independent  PATIENT GOALS: decrease pain during intercourse, increase lumbopelvic strength   PERTINENT HISTORY:  Breast Lumpectomy Sexual abuse: No  BOWEL MOVEMENT: Pain with bowel movement: Yes Type of bowel movement:Type (Bristol Stool Scale) 4, Frequency every few days (has always been the case), Strain Yes, and Splinting no Fully empty rectum: yes Leakage: No Pads: No Fiber supplement: Yes: iron pills   URINATION: Pain with urination: No Fully empty bladder: yes Stream: Strong Urgency: No Frequency: WNL Leakage: Coughing, Sneezing, Laughing, and Exercise Pads: No  INTERCOURSE: Pain with intercourse: During Penetration and Pain Interrupts Intercourse Ability to have vaginal penetration:  yes Climax: yes Marinoff Scale: 2/3  PREGNANCY: Vaginal deliveries 1  Tearing Yes: 2nd degree Currently pregnant No  PROLAPSE: None   OBJECTIVE:  Note: Objective measures were completed at Evaluation unless otherwise noted.  DIAGNOSTIC FINDINGS:  N/A  PATIENT SURVEYS:  PFIQ-7: 14 POPIQ-7: 14 POPIQ-7: 10  COGNITION: Overall cognitive status: Within functional limits for tasks assessed     SENSATION: Light touch: Appears intact Proprioception: Appears intact  LUMBAR SPECIAL TESTS:  Single leg stance test: Positive  FUNCTIONAL TESTS:  Squat: mild dynamic knee valgus with  sit to stand transfer  GAIT: Mild trendelenburg gait pattern with ambulation  POSTURE: No Significant postural limitations  PELVIC ALIGNMENT: WNL  LUMBARAROM/PROM:  A/PROM A/PROM  eval  Flexion WNL  Extension WNL  Right lateral flexion 25% limited  Left lateral flexion 25% limited  Right rotation 25% limited  Left rotation 25% limited   (Blank rows = not tested)  LOWER EXTREMITY ROM: WNL  LOWER EXTREMITY MMT: WNL unless stated otherwise   MMT Right eval Left eval  Hip flexion 5/5 5/5  Hip extension 4/5 4/5  Hip abduction 4/5 4/5  Hip adduction 4/5 4/5   PALPATION:   General  no tenderness to palpation in adductors/pubic bone/perineum                External Perineal Exam: no significant dryness or muscle tension noted                             Internal Pelvic Floor: mild overactivity noted in deep pelvic floor bilaterally, no pain with palpation of deep or superficial musculature, unable to fully lengthen pelvic floor musculature   Patient confirms identification and approves PT to assess internal pelvic floor and treatment Yes  PELVIC MMT:   MMT eval 11/25/23  Vaginal 4/5, 10 quick flicks, 10 second hold 4/5  Diastasis Recti WNL   (Blank rows = not tested)       TONE: Mild muscle overactivity in deep pelvic floor bilaterally   PROLAPSE: Anterior vaginal wall laxity with cough test    TODAY'S TREATMENT:     12/09/23 Manual:  Myofascial release: Release of the urogenital diaphragm gong through the different layers Spinal soft tissue mobilization: bilateral quadratus lumborum, lumbar paraspinals, upper gluteus max Internal pelvic floor techniques: No emotional/communication barriers or cognitive limitation. Patient is motivated to learn. Patient understands and agrees with treatment goals and plan. PT explains patient will be examined in standing, sitting, and lying down to see how their muscles and joints work. When they are ready, they will be asked to  remove their underwear so PT can examine their perineum. The patient is also given the option of providing their own chaperone as one is not provided in our facility. The patient also has the right and is explained the right  to defer or refuse any part of the evaluation or treatment including the internal exam. With the patient's consent, PT will use one gloved finger to gently assess the muscles of the pelvic floor, seeing how well it contracts and relaxes and if there is muscle symmetry. After, the patient will get dressed and PT and patient will discuss exam findings and plan of care. PT and patient discuss plan of care, schedule, attendance policy and HEP activities.  Going through the vaginal canal working on the levator ani then working on releasing around the cervix, fallopian tube, and sides of the bladder  11/25/23 Manual: Myofascial release: Release of the urogenital diaphragm gong through the different layers Spinal mobilization: Internal pelvic floor techniques: No emotional/communication barriers or cognitive limitation. Patient is motivated to learn. Patient understands and agrees with treatment goals and plan. PT explains patient will be examined in standing, sitting, and lying down to see how their muscles and joints work. When they are ready, they will be asked to remove their underwear so PT can examine their perineum. The patient is also given the option of providing their own chaperone as one is not provided in our facility. The patient also has the right and is explained the right to defer or refuse any part of the evaluation or treatment including the internal exam. With the patient's consent, PT will use one gloved finger to gently assess the muscles of the pelvic floor, seeing how well it contracts and relaxes and if there is muscle symmetry. After, the patient will get dressed and PT and patient will discuss exam findings and plan of care. PT and patient discuss plan of care,  schedule, attendance policy and HEP activities.  Going through the vaginal canal working on the levator ani then working on releasing around the cervix, fallopian tube, and sides of the bladder Exercises: Stretches/mobility: Piriformis stretch holding 30 sec sitting Sitting hip adductor stretch holding 30 sec Sit like a z and lift the leg into internal rotation then lift hips in the air Hip adductor stretch in squat position with rotating foot in and out then rocking hips forward and back    11/18/23 Manual: Soft tissue mobilization: To assess for dry needling Manual work to the lumbar paraspinals to elongate after dry needling Spinal mobilization: PA and rotational mobilization to the thoracic lumbar spine Trigger Point Dry Needling  Subsequent Treatment: Instructions provided previously at initial dry needling treatment.  Instructions reviewed, if requested by the patient, prior to subsequent dry needling treatment.   Patient Verbal Consent Given: Yes Education Handout Provided: Previously Provided Muscles Treated: lumbar multifidi Electrical Stimulation Performed: No Treatment Response/Outcome: elongation of tissue and trigger point response Neuromuscular re-education: Down training: Sitting in squat position on foam roll with diaphragmatic breathing to relax the pelvic floor Sit on foam roll to massage the pelvic going side to side Sit on foam roll to massage the piriformis Exercises: Stretches/mobility: Supine on foam roller to increase thoracic and lumbar extension Pigeon pose holding 30 sec bil.  Pulling leg across trunk to stretch posterior hip holding 30 sec bil.  Sit on foam roll to massage the pelvic going side to side    11/11/23 Manual: Soft tissue mobilization: To assess for dry needling Manual work to the lumbar paraspinals to elongate after dry needling Quadruped pulling the tissue to the abdomen  Quadruped with pulling up on the pubic bone tissue to elongate  as patient moves back and forth Quadruped placing hands into the diaphragm and  elongate the area as patient breaths Trigger Point Dry Needling  Initial Treatment: Pt instructed on Dry Needling rational, procedures, and possible side effects. Pt instructed to expect mild to moderate muscle soreness later in the day and/or into the next day.  Pt instructed in methods to reduce muscle soreness. Pt instructed to continue prescribed HEP. Because Dry Needling was performed over or adjacent to a lung field, pt was educated on S/S of pneumothorax and to seek immediate medical attention should they occur.  Patient was educated on signs and symptoms of infection and other risk factors and advised to seek medical attention should they occur.  Patient verbalized understanding of these instructions and education.   Patient Verbal Consent Given: Yes Education Handout Provided: Yes Muscles Treated: lumbar multifidi Electrical Stimulation Performed: No Treatment Response/Outcome: elongation of tissue and trigger point response Exercises: Stretches/mobility: Hip flexor stretch in half kneeling and lateral trunk sidebend Open book with breath to open up the lower rib cage.  Strengthening: Transverse abdominus with contraction of the abdomen and using her hands to assist the lower rib cage downward Hip flexion isometric with core engagement Supine alternate shoulder flexion with core engagement Bridges                                                                                                                             11/04/2023  EVAL  Hooklying diaphragmatic breathing with emphasis on pelvic floor lengthening during inhalation Child's pose + diaphragmatic breathing for pelvic floor relaxation and lumbopelvic stretching   PATIENT EDUCATION:  11/18/23 Education details: Access Code: [email or web address], information on dry needling Person educated: Patient Education method: Explanation, Demonstration,  Tactile cues, Verbal cues, and Handouts Education comprehension: verbalized understanding, returned demonstration, verbal cues required, and tactile cues required  HOME EXERCISE PROGRAM: 11/18/23 Access Code: [email or web address] URL: [email or web address] Date: 11/18/2023 Prepared by: Eulis Foster  Exercises - Supine Diaphragmatic Breathing  - 1 x daily - 7 x weekly - 3 sets - 10 reps - Child's Pose Stretch  - 1 x daily - 7 x weekly - 2 sets - hold - Sidelying Thoracic Rotation with Open Book  - 1 x daily - 7 x weekly - 1 sets - 10 reps - Half Kneeling Hip Flexor Stretch with Sidebend  - 1 x daily - 7 x weekly - 1 sets - 1 reps - 30 sec hold - Hooklying Transversus Abdominis Palpation  - 1 x daily - 7 x weekly - 1 sets - 10 reps - Hooklying Isometric Hip Flexion  - 1 x daily - 7 x weekly - 1 sets - 10 reps - Piriformis Mobilization on Foam Roll  - 1 x daily - 7 x weekly - 3 sets - 10 reps - Thoracic Stretch on Foam Roll - Hands Clasped  - 1 x daily - 7 x weekly - 3 sets - 10 reps  ASSESSMENT:  CLINICAL IMPRESSION: Patient is a 33 y.o. female who was seen today  for physical therapy  treatment for deep pelvic pain during intercourse (5-6/10). Patient reports her back pain is 60% better, with 7/10 pain at rest today. The only instances of leakage she has seen recently has been with a forceful laugh. Internal treatment provided today to decrease pelvic floor muscle tension and lumbar muscle tension bilaterally. She has increased mobility of the cervix, fallopian tube and sides of the bladder after the manual work. She did not have pain at the end of the session. Overall, patient tolerated treatment well and will benefit from skilled intervention to address pelvic floor coordination, control, strength, and urinary incontinence.  OBJECTIVE IMPAIRMENTS: decreased coordination, decreased endurance, decreased mobility, decreased ROM, decreased strength, and pain.   ACTIVITY LIMITATIONS:  continence  PARTICIPATION LIMITATIONS:  vaginal penetration  PERSONAL FACTORS: Past/current experiences and Time since onset of injury/illness/exacerbation are also affecting patient's functional outcome.   REHAB POTENTIAL: Good  CLINICAL DECISION MAKING: Stable/uncomplicated  EVALUATION COMPLEXITY: Low   GOALS: Goals reviewed with patient? Yes  SHORT TERM GOALS: Target date: 12/02/23 Patient to report independence with HEP to decrease pelvic pain and improve quality of life. Baseline: not instructed yet Goal status: Met 11/17/13  2.  Patient to demonstrate decreased POPIQ-7 score of < or = to 7 points to suggest decreased functional limitations secondary to pelvic pain to improve quality of life.  Baseline: 10 points  Goal status: ONGOING   LONG TERM GOALS: Target date: 05/03/24  Patient to report independence with advanced HEP to decrease pelvic pain and improve quality of life.  Baseline: not instructed yet Goal status: INITIAL  2.  Patient will report little to no (1-2/10) pain with penile penetration vaginally to improve QOL. Baseline: pain level 10/10 Goal status: INITIAL  3.  Patient will demonstrate full pelvic floor muscle A/ROM to decrease urinary leakage with sneezing/coughing/jumping and improve quality of life.  Baseline: 50% limited  Goal status: INITIAL  4.  Patient will report little to no (1-2/10) low back pain at rest to improve QOL and allow patient to complete ADLs/work at home.  Baseline: pain level 10/10 Goal status: INITIAL  PLAN:  PT FREQUENCY: 1x/week  PT DURATION: 6 months  PLANNED INTERVENTIONS: 97110-Therapeutic exercises, 97530- Therapeutic activity, 97112- Neuromuscular re-education, 97535- Self Care, [id number]- Manual therapy, Taping, Dry Needling, Scar mobilization, Cryotherapy, and Moist heat  PLAN FOR NEXT SESSION: dry needling for back and deep pelvic floor muscles if needed, internal manual muscle release, introduce activation of pelvic  floor with breathing, introduce hip strengthening, continue lumbopelvic mobility   Earna Coder, PT, DPT 12/09/23 11:05 AM

## 2023-12-16 ENCOUNTER — Ambulatory Visit: Payer: Medicaid Other | Admitting: Physical Therapy

## 2023-12-16 DIAGNOSIS — N393 Stress incontinence (female) (male): Secondary | ICD-10-CM

## 2023-12-16 DIAGNOSIS — M6281 Muscle weakness (generalized): Secondary | ICD-10-CM

## 2023-12-16 DIAGNOSIS — R279 Unspecified lack of coordination: Secondary | ICD-10-CM

## 2023-12-16 NOTE — Therapy (Signed)
 OUTPATIENT PHYSICAL THERAPY FEMALE PELVIC TREATMENT   Patient Name: Sharon Khan MRN: [id number] DOB:11-18-1990, 33 y.o., female Today's Date: 12/16/2023  END OF SESSION:  PT End of Session - 12/16/23 1037     Visit Number 6    Date for PT Re-Evaluation 05/03/24    Authorization Type Medicaid    Authorization Time Period 11/04/23-01/03/24    Authorization - Number of Visits 4    PT Start Time 1028    PT Stop Time 1100    PT Time Calculation (min) 32 min    Activity Tolerance Patient tolerated treatment well    Behavior During Therapy [facility] for tasks assessed/performed              Past Medical History:  Diagnosis Date   Anemia    Vaginal Pap smear, abnormal    Past Surgical History:  Procedure Laterality Date   BREAST LUMPECTOMY  2012   Patient Active Problem List   Diagnosis Date Noted   IDA (iron deficiency anemia) 11/27/2021   Pre-eclampsia in third trimester 11/26/2021   SVD (spontaneous vaginal delivery) 11/26/2021   Thrombocytopenia affecting pregnancy (HCC) 11/25/2021    PCP: none  REFERRING PROVIDER: Barnie Mort, PA-C   REFERRING DIAG: N94.10 (ICD-10-CM) - Unspecified dyspareunia   THERAPY DIAG:  Muscle weakness (generalized)  Unspecified lack of coordination  Stress incontinence (female) (female)  Rationale for Evaluation and Treatment: Rehabilitation  ONSET DATE: 2007  SUBJECTIVE:                                                                                                                                                                                           SUBJECTIVE STATEMENT: Patient reports that she got an IUD Monday and she has experienced cramping from this. She is spotting. No urinary leakage to report. 5/10 back pain today.  Fluid intake: water   PAIN:  Are you having pain? Yes: NPRS scale: 6 Pain location: low back to the low thoracic Pain description: aching shooting Aggravating factors: constant Relieving factors:  stretch  PAIN:  Are you having pain? Yes NPRS scale: 5-6/10 Pain location: Internal and Deep  Pain type: aching and sharp Pain description: intermittent   Aggravating factors: vaginal intercourse, position dependent  Relieving factors: positions where she is in control of depth   PRECAUTIONS: None  RED FLAGS: None   WEIGHT BEARING RESTRICTIONS: No  FALLS:  Has patient fallen in last 6 months? No  LIVING ENVIRONMENT: Lives with: lives with their family Lives in: House/apartment  OCCUPATION: stay at home mom   PLOF: Independent  PATIENT GOALS: decrease pain during intercourse, increase lumbopelvic strength  PERTINENT HISTORY:  Breast Lumpectomy Sexual abuse: No  BOWEL MOVEMENT: Pain with bowel movement: Yes Type of bowel movement:Type (Bristol Stool Scale) 4, Frequency every few days (has always been the case), Strain Yes, and Splinting no Fully empty rectum: yes Leakage: No Pads: No Fiber supplement: Yes: iron pills   URINATION: Pain with urination: No Fully empty bladder: yes Stream: Strong Urgency: No Frequency: WNL Leakage: Coughing, Sneezing, Laughing, and Exercise Pads: No  INTERCOURSE: Pain with intercourse: During Penetration and Pain Interrupts Intercourse Ability to have vaginal penetration:  yes Climax: yes Marinoff Scale: 2/3  PREGNANCY: Vaginal deliveries 1  Tearing Yes: 2nd degree Currently pregnant No  PROLAPSE: None   OBJECTIVE:  Note: Objective measures were completed at Evaluation unless otherwise noted.  DIAGNOSTIC FINDINGS:  N/A  PATIENT SURVEYS:  PFIQ-7: 14 POPIQ-7: 14 POPIQ-7: 10  COGNITION: Overall cognitive status: Within functional limits for tasks assessed     SENSATION: Light touch: Appears intact Proprioception: Appears intact  LUMBAR SPECIAL TESTS:  Single leg stance test: Positive  FUNCTIONAL TESTS:  Squat: mild dynamic knee valgus with sit to stand transfer  GAIT: Mild trendelenburg gait  pattern with ambulation  POSTURE: No Significant postural limitations  PELVIC ALIGNMENT: WNL  LUMBARAROM/PROM:  A/PROM A/PROM  eval  Flexion WNL  Extension WNL  Right lateral flexion 25% limited  Left lateral flexion 25% limited  Right rotation 25% limited  Left rotation 25% limited   (Blank rows = not tested)  LOWER EXTREMITY ROM: WNL  LOWER EXTREMITY MMT: WNL unless stated otherwise   MMT Right eval Left eval  Hip flexion 5/5 5/5  Hip extension 4/5 4/5  Hip abduction 4/5 4/5  Hip adduction 4/5 4/5   PALPATION:   General  no tenderness to palpation in adductors/pubic bone/perineum                External Perineal Exam: no significant dryness or muscle tension noted                             Internal Pelvic Floor: mild overactivity noted in deep pelvic floor bilaterally, no pain with palpation of deep or superficial musculature, unable to fully lengthen pelvic floor musculature   Patient confirms identification and approves PT to assess internal pelvic floor and treatment Yes  PELVIC MMT:   MMT eval 11/25/23  Vaginal 4/5, 10 quick flicks, 10 second hold 4/5  Diastasis Recti WNL   (Blank rows = not tested)       TONE: Mild muscle overactivity in deep pelvic floor bilaterally   PROLAPSE: Anterior vaginal wall laxity with cough test    TODAY'S TREATMENT:     12/16/23 Manual:  Myofascial release: Release of the urogenital diaphragm gong through the different layers Trigger Point Dry Needling Subsequent Treatment: Instructions provided previously at initial dry needling treatment.  Patient Verbal Consent Given: Yes Education Handout Provided: Previously Provided Muscles Treated: gluteus max, gluteus med, lumbar multifidi bilaterally  Electrical Stimulation Performed: No Treatment Response/Outcome: decreased trigger point palpation in glutes, lower back pain decreased to 3/10 Internal pelvic floor techniques: No emotional/communication barriers or cognitive  limitation. Patient is motivated to learn. Patient understands and agrees with treatment goals and plan. PT explains patient will be examined in standing, sitting, and lying down to see how their muscles and joints work. When they are ready, they will be asked to remove their underwear so PT can examine their perineum. The patient is also  given the option of providing their own chaperone as one is not provided in our facility. The patient also has the right and is explained the right to defer or refuse any part of the evaluation or treatment including the internal exam. With the patient's consent, PT will use one gloved finger to gently assess the muscles of the pelvic floor, seeing how well it contracts and relaxes and if there is muscle symmetry. After, the patient will get dressed and PT and patient will discuss exam findings and plan of care. PT and patient discuss plan of care, schedule, attendance policy and HEP activities.  Going through the vaginal canal working on the levator ani then working on releasing around the cervix, fallopian tube, and sides of the bladder  12/09/23 Manual:  Myofascial release: Release of the urogenital diaphragm gong through the different layers Spinal soft tissue mobilization: bilateral quadratus lumborum, lumbar paraspinals, upper gluteus max Internal pelvic floor techniques: No emotional/communication barriers or cognitive limitation. Patient is motivated to learn. Patient understands and agrees with treatment goals and plan. PT explains patient will be examined in standing, sitting, and lying down to see how their muscles and joints work. When they are ready, they will be asked to remove their underwear so PT can examine their perineum. The patient is also given the option of providing their own chaperone as one is not provided in our facility. The patient also has the right and is explained the right to defer or refuse any part of the evaluation or treatment including  the internal exam. With the patient's consent, PT will use one gloved finger to gently assess the muscles of the pelvic floor, seeing how well it contracts and relaxes and if there is muscle symmetry. After, the patient will get dressed and PT and patient will discuss exam findings and plan of care. PT and patient discuss plan of care, schedule, attendance policy and HEP activities.  Going through the vaginal canal working on the levator ani then working on releasing around the cervix, fallopian tube, and sides of the bladder  11/25/23 Manual: Myofascial release: Release of the urogenital diaphragm gong through the different layers Spinal mobilization: Internal pelvic floor techniques: No emotional/communication barriers or cognitive limitation. Patient is motivated to learn. Patient understands and agrees with treatment goals and plan. PT explains patient will be examined in standing, sitting, and lying down to see how their muscles and joints work. When they are ready, they will be asked to remove their underwear so PT can examine their perineum. The patient is also given the option of providing their own chaperone as one is not provided in our facility. The patient also has the right and is explained the right to defer or refuse any part of the evaluation or treatment including the internal exam. With the patient's consent, PT will use one gloved finger to gently assess the muscles of the pelvic floor, seeing how well it contracts and relaxes and if there is muscle symmetry. After, the patient will get dressed and PT and patient will discuss exam findings and plan of care. PT and patient discuss plan of care, schedule, attendance policy and HEP activities.  Going through the vaginal canal working on the levator ani then working on releasing around the cervix, fallopian tube, and sides of the bladder Exercises: Stretches/mobility: Piriformis stretch holding 30 sec sitting Sitting hip adductor stretch  holding 30 sec Sit like a z and lift the leg into internal rotation then lift hips in the  air Hip adductor stretch in squat position with rotating foot in and out then rocking hips forward and back   PATIENT EDUCATION:  11/18/23 Education details: Access Code: [email or web address], information on dry needling Person educated: Patient Education method: Explanation, Demonstration, Tactile cues, Verbal cues, and Handouts Education comprehension: verbalized understanding, returned demonstration, verbal cues required, and tactile cues required  HOME EXERCISE PROGRAM: 11/18/23 Access Code: [email or web address] URL: [email or web address] Date: 11/18/2023 Prepared by: Eulis Foster  Exercises - Supine Diaphragmatic Breathing  - 1 x daily - 7 x weekly - 3 sets - 10 reps - Child's Pose Stretch  - 1 x daily - 7 x weekly - 2 sets - hold - Sidelying Thoracic Rotation with Open Book  - 1 x daily - 7 x weekly - 1 sets - 10 reps - Half Kneeling Hip Flexor Stretch with Sidebend  - 1 x daily - 7 x weekly - 1 sets - 1 reps - 30 sec hold - Hooklying Transversus Abdominis Palpation  - 1 x daily - 7 x weekly - 1 sets - 10 reps - Hooklying Isometric Hip Flexion  - 1 x daily - 7 x weekly - 1 sets - 10 reps - Piriformis Mobilization on Foam Roll  - 1 x daily - 7 x weekly - 3 sets - 10 reps - Thoracic Stretch on Foam Roll - Hands Clasped  - 1 x daily - 7 x weekly - 3 sets - 10 reps  ASSESSMENT:  CLINICAL IMPRESSION: Patient is a 33 y.o. female who was seen today for physical therapy  treatment for deep pelvic pain during intercourse (5-6/10). Patient reports her back pain is 60% better, with 5/10 pain at rest today. Patient is pleased to report no urinary leakage this week. Internal treatment provided today to decrease pelvic floor muscle tension and lumbar muscle tension bilaterally. She has increased mobility of the cervix, fallopian tube and sides of the bladder after the manual work, along with decreased muscle  tension in left pelvic floor musculature. Dry needling performed to glutes and lumbar multifidi to decrease overall lumbopelvic tension. Overall, patient tolerated treatment well and will benefit from skilled intervention to address pelvic floor coordination, control, strength, and urinary incontinence.  OBJECTIVE IMPAIRMENTS: decreased coordination, decreased endurance, decreased mobility, decreased ROM, decreased strength, and pain.   ACTIVITY LIMITATIONS: continence  PARTICIPATION LIMITATIONS:  vaginal penetration  PERSONAL FACTORS: Past/current experiences and Time since onset of injury/illness/exacerbation are also affecting patient's functional outcome.   REHAB POTENTIAL: Good  CLINICAL DECISION MAKING: Stable/uncomplicated  EVALUATION COMPLEXITY: Low   GOALS: Goals reviewed with patient? Yes  SHORT TERM GOALS: Target date: 12/02/23 Patient to report independence with HEP to decrease pelvic pain and improve quality of life. Baseline: not instructed yet Goal status: Met 11/17/13  2.  Patient to demonstrate decreased POPIQ-7 score of < or = to 7 points to suggest decreased functional limitations secondary to pelvic pain to improve quality of life.  Baseline: 10 points  Goal status: ONGOING   LONG TERM GOALS: Target date: 05/03/24  Patient to report independence with advanced HEP to decrease pelvic pain and improve quality of life.  Baseline: not instructed yet Goal status: INITIAL  2.  Patient will report little to no (1-2/10) pain with penile penetration vaginally to improve QOL. Baseline: pain level 10/10 Goal status: INITIAL  3.  Patient will demonstrate full pelvic floor muscle A/ROM to decrease urinary leakage with sneezing/coughing/jumping and improve quality of life.  Baseline: 50% limited  Goal status: INITIAL  4.  Patient will report little to no (1-2/10) low back pain at rest to improve QOL and allow patient to complete ADLs/work at home.  Baseline: pain level  10/10 Goal status: INITIAL  PLAN:  PT FREQUENCY: 1x/week  PT DURATION: 6 months  PLANNED INTERVENTIONS: 97110-Therapeutic exercises, 97530- Therapeutic activity, 97112- Neuromuscular re-education, 97535- Self Care, [id number]- Manual therapy, Taping, Dry Needling, Scar mobilization, Cryotherapy, and Moist heat  PLAN FOR NEXT SESSION: dry needling for back and deep pelvic floor muscles if needed, internal manual muscle release, introduce activation of pelvic floor with breathing, introduce hip strengthening, continue lumbopelvic mobility   Earna Coder, PT, DPT 12/16/23 11:39 AM

## 2023-12-23 ENCOUNTER — Ambulatory Visit: Payer: Medicaid Other | Admitting: Physical Therapy

## 2023-12-30 ENCOUNTER — Ambulatory Visit: Payer: Medicaid Other | Admitting: Physical Therapy

## 2023-12-30 DIAGNOSIS — M6281 Muscle weakness (generalized): Secondary | ICD-10-CM | POA: Diagnosis not present

## 2023-12-30 DIAGNOSIS — R279 Unspecified lack of coordination: Secondary | ICD-10-CM

## 2023-12-30 DIAGNOSIS — N393 Stress incontinence (female) (male): Secondary | ICD-10-CM

## 2023-12-30 NOTE — Therapy (Signed)
 OUTPATIENT PHYSICAL THERAPY FEMALE PELVIC TREATMENT   Patient Name: Sharon Khan MRN: [id number] DOB:1991/01/06, 33 y.o., female Today's Date: 12/30/2023  END OF SESSION:  PT End of Session - 12/30/23 1100     Visit Number 7    Date for PT Re-Evaluation 05/03/24    Authorization Type Medicaid    Authorization Time Period 11/04/23-01/03/24    Authorization - Number of Visits 4    PT Start Time 1021    PT Stop Time 1100    PT Time Calculation (min) 39 min    Activity Tolerance Patient tolerated treatment well    Behavior During Therapy [facility] for tasks assessed/performed               Past Medical History:  Diagnosis Date   Anemia    Vaginal Pap smear, abnormal    Past Surgical History:  Procedure Laterality Date   BREAST LUMPECTOMY  2012   Patient Active Problem List   Diagnosis Date Noted   IDA (iron deficiency anemia) 11/27/2021   Pre-eclampsia in third trimester 11/26/2021   SVD (spontaneous vaginal delivery) 11/26/2021   Thrombocytopenia affecting pregnancy (HCC) 11/25/2021    PCP: none  REFERRING PROVIDER: Barnie Mort, PA-C   REFERRING DIAG: N94.10 (ICD-10-CM) - Unspecified dyspareunia   THERAPY DIAG:  Muscle weakness (generalized)  Unspecified lack of coordination  Stress incontinence (female) (female)  Rationale for Evaluation and Treatment: Rehabilitation  ONSET DATE: 2007  SUBJECTIVE:                                                                                                                                                                                           SUBJECTIVE STATEMENT: Patient reports that her IUD has been - spotting lasted 1 week. No urinary leakage to report. Back pain is present - 5-6/10.  Fluid intake: water   PAIN:  Are you having pain? Yes: NPRS scale: 6 Pain location: low back to the low thoracic Pain description: aching shooting Aggravating factors: constant Relieving factors: stretch  PAIN:  Are you having  pain? Yes NPRS scale: 5-6/10 Pain location: Internal and Deep  Pain type: aching and sharp Pain description: intermittent   Aggravating factors: vaginal intercourse, position dependent  Relieving factors: positions where she is in control of depth   PRECAUTIONS: None  RED FLAGS: None   WEIGHT BEARING RESTRICTIONS: No  FALLS:  Has patient fallen in last 6 months? No  LIVING ENVIRONMENT: Lives with: lives with their family Lives in: House/apartment  OCCUPATION: stay at home mom   PLOF: Independent  PATIENT GOALS: decrease pain during intercourse, increase lumbopelvic strength   PERTINENT HISTORY:  Breast Lumpectomy Sexual abuse: No  BOWEL MOVEMENT: Pain with bowel movement: Yes Type of bowel movement:Type (Bristol Stool Scale) 4, Frequency every few days (has always been the case), Strain Yes, and Splinting no Fully empty rectum: yes Leakage: No Pads: No Fiber supplement: Yes: iron pills   URINATION: Pain with urination: No Fully empty bladder: yes Stream: Strong Urgency: No Frequency: WNL Leakage: Coughing, Sneezing, Laughing, and Exercise Pads: No  INTERCOURSE: Pain with intercourse: During Penetration and Pain Interrupts Intercourse Ability to have vaginal penetration:  yes Climax: yes Marinoff Scale: 2/3  PREGNANCY: Vaginal deliveries 1  Tearing Yes: 2nd degree Currently pregnant No  PROLAPSE: None   OBJECTIVE:  Note: Objective measures were completed at Evaluation unless otherwise noted.  DIAGNOSTIC FINDINGS:  N/A  PATIENT SURVEYS:  PFIQ-7: 14 POPIQ-7: 14 POPIQ-7: 10  COGNITION: Overall cognitive status: Within functional limits for tasks assessed     SENSATION: Light touch: Appears intact Proprioception: Appears intact  LUMBAR SPECIAL TESTS:  Single leg stance test: Positive  FUNCTIONAL TESTS:  Squat: mild dynamic knee valgus with sit to stand transfer  GAIT: Mild trendelenburg gait pattern with ambulation  POSTURE: No  Significant postural limitations  PELVIC ALIGNMENT: WNL  LUMBARAROM/PROM:  A/PROM A/PROM  eval  Flexion WNL  Extension WNL  Right lateral flexion 25% limited  Left lateral flexion 25% limited  Right rotation 25% limited  Left rotation 25% limited   (Blank rows = not tested)  LOWER EXTREMITY ROM: WNL  LOWER EXTREMITY MMT: WNL unless stated otherwise   MMT Right eval Left eval  Hip flexion 5/5 5/5  Hip extension 4/5 4/5  Hip abduction 4/5 4/5  Hip adduction 4/5 4/5   PALPATION:   General  no tenderness to palpation in adductors/pubic bone/perineum                External Perineal Exam: no significant dryness or muscle tension noted                             Internal Pelvic Floor: mild overactivity noted in deep pelvic floor bilaterally, no pain with palpation of deep or superficial musculature, unable to fully lengthen pelvic floor musculature   Patient confirms identification and approves PT to assess internal pelvic floor and treatment Yes  PELVIC MMT:   MMT eval 11/25/23  Vaginal 4/5, 10 quick flicks, 10 second hold 4/5  Diastasis Recti WNL   (Blank rows = not tested)       TONE: Mild muscle overactivity in deep pelvic floor bilaterally   PROLAPSE: Anterior vaginal wall laxity with cough test    TODAY'S TREATMENT:     12/30/23 Manual:  Myofascial release: Trigger Point Dry Needling Subsequent Treatment: Instructions provided previously at initial dry needling treatment.  Patient Verbal Consent Given: Yes Education Handout Provided: Previously Provided Muscles Treated: gluteus max, gluteus med, lumbar multifidi bilaterally  Electrical Stimulation Performed: No Treatment Response/Outcome: decreased trigger point palpation in glutes, lower back pain decreased to 3/10 Neuro re-ed: Hooklying transverse abdominis activation with opposite ball press across body + diaphragmatic breathing 2x10  Dead bug heel taps + diaphragmatic breathing 2x10    12/16/23 Manual:  Myofascial release: Release of the urogenital diaphragm gong through the different layers Trigger Point Dry Needling Subsequent Treatment: Instructions provided previously at initial dry needling treatment.  Patient Verbal Consent Given: Yes Education Handout Provided: Previously Provided Muscles Treated: gluteus max, gluteus med, lumbar multifidi bilaterally  Electrical Stimulation  Performed: No Treatment Response/Outcome: decreased trigger point palpation in glutes, lower back pain decreased to 3/10 Internal pelvic floor techniques: No emotional/communication barriers or cognitive limitation. Patient is motivated to learn. Patient understands and agrees with treatment goals and plan. PT explains patient will be examined in standing, sitting, and lying down to see how their muscles and joints work. When they are ready, they will be asked to remove their underwear so PT can examine their perineum. The patient is also given the option of providing their own chaperone as one is not provided in our facility. The patient also has the right and is explained the right to defer or refuse any part of the evaluation or treatment including the internal exam. With the patient's consent, PT will use one gloved finger to gently assess the muscles of the pelvic floor, seeing how well it contracts and relaxes and if there is muscle symmetry. After, the patient will get dressed and PT and patient will discuss exam findings and plan of care. PT and patient discuss plan of care, schedule, attendance policy and HEP activities.  Going through the vaginal canal working on the levator ani then working on releasing around the cervix, fallopian tube, and sides of the bladder  12/09/23 Manual:  Myofascial release: Release of the urogenital diaphragm gong through the different layers Spinal soft tissue mobilization: bilateral quadratus lumborum, lumbar paraspinals, upper gluteus max Internal pelvic  floor techniques: No emotional/communication barriers or cognitive limitation. Patient is motivated to learn. Patient understands and agrees with treatment goals and plan. PT explains patient will be examined in standing, sitting, and lying down to see how their muscles and joints work. When they are ready, they will be asked to remove their underwear so PT can examine their perineum. The patient is also given the option of providing their own chaperone as one is not provided in our facility. The patient also has the right and is explained the right to defer or refuse any part of the evaluation or treatment including the internal exam. With the patient's consent, PT will use one gloved finger to gently assess the muscles of the pelvic floor, seeing how well it contracts and relaxes and if there is muscle symmetry. After, the patient will get dressed and PT and patient will discuss exam findings and plan of care. PT and patient discuss plan of care, schedule, attendance policy and HEP activities.  Going through the vaginal canal working on the levator ani then working on releasing around the cervix, fallopian tube, and sides of the bladder  11/25/23 Manual: Myofascial release: Release of the urogenital diaphragm gong through the different layers Spinal mobilization: Internal pelvic floor techniques: No emotional/communication barriers or cognitive limitation. Patient is motivated to learn. Patient understands and agrees with treatment goals and plan. PT explains patient will be examined in standing, sitting, and lying down to see how their muscles and joints work. When they are ready, they will be asked to remove their underwear so PT can examine their perineum. The patient is also given the option of providing their own chaperone as one is not provided in our facility. The patient also has the right and is explained the right to defer or refuse any part of the evaluation or treatment including the internal  exam. With the patient's consent, PT will use one gloved finger to gently assess the muscles of the pelvic floor, seeing how well it contracts and relaxes and if there is muscle symmetry. After, the patient will get  dressed and PT and patient will discuss exam findings and plan of care. PT and patient discuss plan of care, schedule, attendance policy and HEP activities.  Going through the vaginal canal working on the levator ani then working on releasing around the cervix, fallopian tube, and sides of the bladder Exercises: Stretches/mobility: Piriformis stretch holding 30 sec sitting Sitting hip adductor stretch holding 30 sec Sit like a z and lift the leg into internal rotation then lift hips in the air Hip adductor stretch in squat position with rotating foot in and out then rocking hips forward and back   PATIENT EDUCATION:  11/18/23 Education details: Access Code: [email or web address], information on dry needling Person educated: Patient Education method: Explanation, Demonstration, Tactile cues, Verbal cues, and Handouts Education comprehension: verbalized understanding, returned demonstration, verbal cues required, and tactile cues required  HOME EXERCISE PROGRAM: Access Code: [email or web address] URL: [email or web address] Date: 12/30/2023 Prepared by: Earna Coder  Exercises - Supine Diaphragmatic Breathing  - 1 x daily - 7 x weekly - 2 sets - 10 reps - Child's Pose Stretch  - 1 x daily - 7 x weekly - 2 sets - hold - Sidelying Thoracic Rotation with Open Book  - 1 x daily - 7 x weekly - 2 sets - 10 reps - Abdominal Press into [place]  - 1 x daily - 7 x weekly - 2 sets - 10 reps - Supine Lower Trunk Rotation  - 1 x daily - 7 x weekly - 2 sets - 10 reps - DNS Bug Heel Touches  - 1 x daily - 7 x weekly - 2 sets - 10 reps  ASSESSMENT:  CLINICAL IMPRESSION: Patient is a 33 y.o. female who was seen today for physical therapy  treatment for lumbopelvic pain. Intercourse pain has improved  significantly, which she is pleased with. Her low back pain is 5/10 today, primarily in the lower lumbar region. She had intercourse recently and thinks this could be why she is tense. Patient is pleased to report no urinary leakage this week. Dry needling performed to glutes and lumbar multifidi to decrease overall lumbopelvic tension. Transverse abdominis training progressed accordingly today with varying positions and level of difficulty to assist lumbar musculature with supporting the trunk. Overall, patient tolerated treatment well and will benefit from skilled intervention to address pelvic floor coordination, control, strength, and urinary incontinence.  OBJECTIVE IMPAIRMENTS: decreased coordination, decreased endurance, decreased mobility, decreased ROM, decreased strength, and pain.   ACTIVITY LIMITATIONS: continence  PARTICIPATION LIMITATIONS:  vaginal penetration  PERSONAL FACTORS: Past/current experiences and Time since onset of injury/illness/exacerbation are also affecting patient's functional outcome.   REHAB POTENTIAL: Good  CLINICAL DECISION MAKING: Stable/uncomplicated  EVALUATION COMPLEXITY: Low   GOALS: Goals reviewed with patient? Yes  SHORT TERM GOALS: Target date: 12/02/23 Patient to report independence with HEP to decrease pelvic pain and improve quality of life. Baseline: not instructed yet Goal status: Met 11/17/13  2.  Patient to demonstrate decreased POPIQ-7 score of < or = to 7 points to suggest decreased functional limitations secondary to pelvic pain to improve quality of life.  Baseline: 10 points  Goal status: ONGOING   LONG TERM GOALS: Target date: 05/03/24  Patient to report independence with advanced HEP to decrease pelvic pain and improve quality of life.  Baseline: not instructed yet Goal status: INITIAL  2.  Patient will report little to no (1-2/10) pain with penile penetration vaginally to improve QOL. Baseline: pain level 10/10 Goal status:  INITIAL  3.  Patient  will demonstrate full pelvic floor muscle A/ROM to decrease urinary leakage with sneezing/coughing/jumping and improve quality of life.  Baseline: 50% limited  Goal status: INITIAL  4.  Patient will report little to no (1-2/10) low back pain at rest to improve QOL and allow patient to complete ADLs/work at home.  Baseline: pain level 10/10 Goal status: INITIAL  PLAN:  PT FREQUENCY: 1x/week  PT DURATION: 6 months  PLANNED INTERVENTIONS: 97110-Therapeutic exercises, 97530- Therapeutic activity, 97112- Neuromuscular re-education, 97535- Self Care, [id number]- Manual therapy, Taping, Dry Needling, Scar mobilization, Cryotherapy, and Moist heat  PLAN FOR NEXT SESSION: dry needling for back and deep pelvic floor muscles if needed, internal manual muscle release, introduce activation of pelvic floor with breathing, introduce hip strengthening, continue lumbopelvic mobility   Earna Coder, PT, DPT 12/30/23 11:00 AM

## 2024-01-07 NOTE — Therapy (Addendum)
 OUTPATIENT PHYSICAL THERAPY FEMALE PELVIC TREATMENT   Patient Name: Sharon Khan MRN: [id number] DOB:[birth year], 33 y.o., female Today's Date: 01/07/2024  END OF SESSION:     Past Medical History:  Diagnosis Date   Anemia    Vaginal Pap smear, abnormal    Past Surgical History:  Procedure Laterality Date   BREAST LUMPECTOMY  2012   Patient Active Problem List   Diagnosis Date Noted   IDA (iron deficiency anemia) 11/27/2021   Pre-eclampsia in third trimester 11/26/2021   SVD (spontaneous vaginal delivery) 11/26/2021   Thrombocytopenia affecting pregnancy (HCC) 11/25/2021    PCP: none  REFERRING PROVIDER: Barnie Mort, PA-C   REFERRING DIAG: N94.10 (ICD-10-CM) - Unspecified dyspareunia   THERAPY DIAG:  Muscle weakness (generalized)  Unspecified lack of coordination  Stress incontinence (female) (female)  Rationale for Evaluation and Treatment: Rehabilitation  ONSET DATE: 2007  SUBJECTIVE:                                                                                                                                                                                           SUBJECTIVE STATEMENT: Patient reports that she got an IUD Monday and she has experienced cramping from this. She is spotting. No urinary leakage to report. 5/10 back pain today.  Fluid intake: water   PAIN:  12/30/23: NPRS scale: 5/10 pain   Are you having pain? Yes: NPRS scale: 6 Pain location: low back to the low thoracic Pain description: aching shooting Aggravating factors: constant Relieving factors: stretch  PAIN:  Are you having pain? Yes NPRS scale: 5-6/10 Pain location: Internal and Deep  Pain type: aching and sharp Pain description: intermittent   Aggravating factors: vaginal intercourse, position dependent  Relieving factors: positions where she is in control of depth   PRECAUTIONS: None  RED FLAGS: None   WEIGHT BEARING RESTRICTIONS: No  FALLS:  Has patient  fallen in last 6 months? No  LIVING ENVIRONMENT: Lives with: lives with their family Lives in: House/apartment  OCCUPATION: stay at home mom   PLOF: Independent  PATIENT GOALS: decrease pain during intercourse, increase lumbopelvic strength   PERTINENT HISTORY:  Breast Lumpectomy Sexual abuse: No  BOWEL MOVEMENT: Pain with bowel movement: Yes Type of bowel movement:Type (Bristol Stool Scale) 4, Frequency every few days (has always been the case), Strain Yes, and Splinting no Fully empty rectum: yes Leakage: No Pads: No Fiber supplement: Yes: iron pills   URINATION: Pain with urination: No Fully empty bladder: yes Stream: Strong Urgency: No Frequency: WNL Leakage: Coughing, Sneezing, Laughing, and Exercise Pads: No  INTERCOURSE: Pain with intercourse: During Penetration and Pain Interrupts Intercourse Ability  to have vaginal penetration:  yes Climax: yes Marinoff Scale: 2/3  PREGNANCY: Vaginal deliveries 1  Tearing Yes: 2nd degree Currently pregnant No  PROLAPSE: None   OBJECTIVE:  Note: Objective measures were completed at Evaluation unless otherwise noted.  DIAGNOSTIC FINDINGS:  N/A  PATIENT SURVEYS:  PFIQ-7: 14 POPIQ-7: 14 12/30/23: POPIQ-7: 10  COGNITION: Overall cognitive status: Within functional limits for tasks assessed     SENSATION: Light touch: Appears intact Proprioception: Appears intact  LUMBAR SPECIAL TESTS:  Single leg stance test: Positive  FUNCTIONAL TESTS:  Squat: mild dynamic knee valgus with sit to stand transfer  GAIT: Mild trendelenburg gait pattern with ambulation  POSTURE: No Significant postural limitations  PELVIC ALIGNMENT: WNL  LUMBARAROM/PROM:  A/PROM A/PROM  eval  Flexion WNL  Extension WNL  Right lateral flexion 25% limited  Left lateral flexion 25% limited  Right rotation 25% limited  Left rotation 25% limited   (Blank rows = not tested)  LOWER EXTREMITY ROM: WNL  LOWER EXTREMITY MMT: WNL  unless stated otherwise   MMT Right eval Left eval  Hip flexion 5/5 5/5  Hip extension 4/5 4/5  Hip abduction 4/5 4/5  Hip adduction 4/5 4/5   PALPATION:   General  no tenderness to palpation in adductors/pubic bone/perineum                External Perineal Exam: no significant dryness or muscle tension noted                             Internal Pelvic Floor: mild overactivity noted in deep pelvic floor bilaterally, no pain with palpation of deep or superficial musculature, unable to fully lengthen pelvic floor musculature   Patient confirms identification and approves PT to assess internal pelvic floor and treatment Yes  PELVIC MMT:   MMT eval 11/25/23 12/30/23  Vaginal 4/5, 10 quick flicks, 10 second hold 4/5 4/5 with pain on left sided levator ani muscle  Diastasis Recti WNL    (Blank rows = not tested)       TONE: Mild muscle overactivity in deep pelvic floor bilaterally   PROLAPSE: Anterior vaginal wall laxity with cough test    TODAY'S TREATMENT:     12/16/23 Manual:  Myofascial release: Release of the urogenital diaphragm gong through the different layers Trigger Point Dry Needling Subsequent Treatment: Instructions provided previously at initial dry needling treatment.  Patient Verbal Consent Given: Yes Education Handout Provided: Previously Provided Muscles Treated: gluteus max, gluteus med, lumbar multifidi bilaterally  Electrical Stimulation Performed: No Treatment Response/Outcome: decreased trigger point palpation in glutes, lower back pain decreased to 3/10 Internal pelvic floor techniques: No emotional/communication barriers or cognitive limitation. Patient is motivated to learn. Patient understands and agrees with treatment goals and plan. PT explains patient will be examined in standing, sitting, and lying down to see how their muscles and joints work. When they are ready, they will be asked to remove their underwear so PT can examine their perineum. The  patient is also given the option of providing their own chaperone as one is not provided in our facility. The patient also has the right and is explained the right to defer or refuse any part of the evaluation or treatment including the internal exam. With the patient's consent, PT will use one gloved finger to gently assess the muscles of the pelvic floor, seeing how well it contracts and relaxes and if there is muscle symmetry. After, the  patient will get dressed and PT and patient will discuss exam findings and plan of care. PT and patient discuss plan of care, schedule, attendance policy and HEP activities.  Going through the vaginal canal working on the levator ani then working on releasing around the cervix, fallopian tube, and sides of the bladder  12/09/23 Manual:  Myofascial release: Release of the urogenital diaphragm gong through the different layers Spinal soft tissue mobilization: bilateral quadratus lumborum, lumbar paraspinals, upper gluteus max Internal pelvic floor techniques: No emotional/communication barriers or cognitive limitation. Patient is motivated to learn. Patient understands and agrees with treatment goals and plan. PT explains patient will be examined in standing, sitting, and lying down to see how their muscles and joints work. When they are ready, they will be asked to remove their underwear so PT can examine their perineum. The patient is also given the option of providing their own chaperone as one is not provided in our facility. The patient also has the right and is explained the right to defer or refuse any part of the evaluation or treatment including the internal exam. With the patient's consent, PT will use one gloved finger to gently assess the muscles of the pelvic floor, seeing how well it contracts and relaxes and if there is muscle symmetry. After, the patient will get dressed and PT and patient will discuss exam findings and plan of care. PT and patient discuss  plan of care, schedule, attendance policy and HEP activities.  Going through the vaginal canal working on the levator ani then working on releasing around the cervix, fallopian tube, and sides of the bladder  11/25/23 Manual: Myofascial release: Release of the urogenital diaphragm gong through the different layers Spinal mobilization: Internal pelvic floor techniques: No emotional/communication barriers or cognitive limitation. Patient is motivated to learn. Patient understands and agrees with treatment goals and plan. PT explains patient will be examined in standing, sitting, and lying down to see how their muscles and joints work. When they are ready, they will be asked to remove their underwear so PT can examine their perineum. The patient is also given the option of providing their own chaperone as one is not provided in our facility. The patient also has the right and is explained the right to defer or refuse any part of the evaluation or treatment including the internal exam. With the patient's consent, PT will use one gloved finger to gently assess the muscles of the pelvic floor, seeing how well it contracts and relaxes and if there is muscle symmetry. After, the patient will get dressed and PT and patient will discuss exam findings and plan of care. PT and patient discuss plan of care, schedule, attendance policy and HEP activities.  Going through the vaginal canal working on the levator ani then working on releasing around the cervix, fallopian tube, and sides of the bladder Exercises: Stretches/mobility: Piriformis stretch holding 30 sec sitting Sitting hip adductor stretch holding 30 sec Sit like a z and lift the leg into internal rotation then lift hips in the air Hip adductor stretch in squat position with rotating foot in and out then rocking hips forward and back   PATIENT EDUCATION:  11/18/23 Education details: Access Code: [email or web address], information on dry needling Person educated:  Patient Education method: Explanation, Demonstration, Tactile cues, Verbal cues, and Handouts Education comprehension: verbalized understanding, returned demonstration, verbal cues required, and tactile cues required  HOME EXERCISE PROGRAM: 11/18/23 Access Code: [email or web address] URL: [email or web address] Date: 11/18/2023 Prepared by: Eulis Foster  Exercises - Supine Diaphragmatic Breathing  - 1 x daily - 7 x weekly - 3 sets - 10 reps - Child's Pose Stretch  - 1 x daily - 7 x weekly - 2 sets - hold - Sidelying Thoracic Rotation with Open Book  - 1 x daily - 7 x weekly - 1 sets - 10 reps - Half Kneeling Hip Flexor Stretch with Sidebend  - 1 x daily - 7 x weekly - 1 sets - 1 reps - 30 sec hold - Hooklying Transversus Abdominis Palpation  - 1 x daily - 7 x weekly - 1 sets - 10 reps - Hooklying Isometric Hip Flexion  - 1 x daily - 7 x weekly - 1 sets - 10 reps - Piriformis Mobilization on Foam Roll  - 1 x daily - 7 x weekly - 3 sets - 10 reps - Thoracic Stretch on Foam Roll - Hands Clasped  - 1 x daily - 7 x weekly - 3 sets - 10 reps  ASSESSMENT:  CLINICAL IMPRESSION: Patient is a 33 y.o. female who was seen today for physical therapy  treatment for deep pelvic pain during intercourse (5-6/10). Patient reports her back pain is 60% better, with 5/10 pain at rest today. Patient is pleased to report no urinary leakage this week. Internal treatment provided today to decrease pelvic floor muscle tension and lumbar muscle tension bilaterally. She has increased mobility of the cervix, fallopian tube and sides of the bladder after the manual work, along with decreased muscle tension in left pelvic floor musculature. Dry needling performed to glutes and lumbar multifidi to decrease overall lumbopelvic tension. Overall, patient tolerated treatment well and will benefit from skilled intervention to address pelvic floor coordination, control, strength, and urinary incontinence.  OBJECTIVE  IMPAIRMENTS: decreased coordination, decreased endurance, decreased mobility, decreased ROM, decreased strength, and pain.   ACTIVITY LIMITATIONS: continence  PARTICIPATION LIMITATIONS:  vaginal penetration  PERSONAL FACTORS: Past/current experiences and Time since onset of injury/illness/exacerbation are also affecting patient's functional outcome.   REHAB POTENTIAL: Good  CLINICAL DECISION MAKING: Stable/uncomplicated  EVALUATION COMPLEXITY: Low   GOALS: Goals reviewed with patient? Yes  SHORT TERM GOALS: Target date: 12/02/23 Patient to report independence with HEP to decrease pelvic pain and improve quality of life. Baseline: not instructed yet Goal status: Met 11/17/13  2.  Patient to demonstrate decreased POPIQ-7 score of < or = to 7 points to suggest decreased functional limitations secondary to pelvic pain to improve quality of life.  Baseline: 10 points  Goal status: ONGOING   LONG TERM GOALS: Target date: 05/03/24  Patient to report independence with advanced HEP to decrease pelvic pain and improve quality of life.  Baseline: not instructed yet Goal status: GOAL MET 12/30/23  2.  Patient will report little to no (1-2/10) pain with penile penetration vaginally to improve QOL. Baseline: pain level 10/10 Goal status: GOAL MET 12/30/23  3.  Patient will demonstrate full pelvic floor muscle A/ROM to decrease urinary leakage with sneezing/coughing/jumping and improve quality of life.  Baseline: 25% limited  Goal status: ONGOING 12/30/23  4.  Patient will report little to no (1-2/10) low back pain at rest to improve QOL and allow patient to complete ADLs/work at home.  Baseline: pain level 5/10 Goal status: ONGOING 12/30/23  PLAN:  PT FREQUENCY: 1x/week  PT DURATION: 6 months  PLANNED INTERVENTIONS: 97110-Therapeutic exercises, 97530- Therapeutic activity, 97112- Neuromuscular re-education, 97535- Self Care, [id number]- Manual therapy, Taping, Dry Needling, Scar  mobilization, Cryotherapy, and Moist  heat  PLAN FOR NEXT SESSION: dry needling for back and deep pelvic floor muscles if needed, internal manual muscle release, introduce activation of pelvic floor with breathing, introduce hip strengthening, continue lumbopelvic mobility   Earna Coder, PT, DPT 01/07/24 12:18 PM

## 2024-01-12 NOTE — Therapy (Signed)
 OUTPATIENT PHYSICAL THERAPY FEMALE PELVIC REASSESSMENT   Patient Name: Sharon Khan MRN: [id number] DOB:1991/06/10, 33 y.o., female Today's Date: 01/12/2024  END OF SESSION:     Past Medical History:  Diagnosis Date   Anemia    Vaginal Pap smear, abnormal    Past Surgical History:  Procedure Laterality Date   BREAST LUMPECTOMY  2012   Patient Active Problem List   Diagnosis Date Noted   IDA (iron deficiency anemia) 11/27/2021   Pre-eclampsia in third trimester 11/26/2021   SVD (spontaneous vaginal delivery) 11/26/2021   Thrombocytopenia affecting pregnancy (HCC) 11/25/2021    PCP: none  REFERRING PROVIDER: Barnie Mort, PA-C   REFERRING DIAG: N94.10 (ICD-10-CM) - Unspecified dyspareunia   THERAPY DIAG:  Muscle weakness (generalized)  Unspecified lack of coordination  Stress incontinence (female) (female)  Rationale for Evaluation and Treatment: Rehabilitation  ONSET DATE: 2007  SUBJECTIVE:                                                                                                                                                                                           SUBJECTIVE STATEMENT: Patient reports that she got an IUD Monday and she has experienced cramping from this. She is spotting. No urinary leakage to report. 5/10 back pain today.  Fluid intake: water   PAIN:  Are you having pain? Yes: NPRS scale: 6 Pain location: low back to the low thoracic Pain description: aching shooting Aggravating factors: constant Relieving factors: stretch  PAIN:   Reassessment 12/30/23: NPRS scale: 5/10   Eval: Are you having pain? Yes NPRS scale: 5-6/10 Pain location: Internal and Deep  Pain type: aching and sharp Pain description: intermittent   Aggravating factors: vaginal intercourse, position dependent  Relieving factors: positions where she is in control of depth   PRECAUTIONS: None  RED FLAGS: None   WEIGHT BEARING RESTRICTIONS:  No  FALLS:  Has patient fallen in last 6 months? No  LIVING ENVIRONMENT: Lives with: lives with their family Lives in: House/apartment  OCCUPATION: stay at home mom   PLOF: Independent  PATIENT GOALS: decrease pain during intercourse, increase lumbopelvic strength   PERTINENT HISTORY:  Breast Lumpectomy Sexual abuse: No  BOWEL MOVEMENT: Pain with bowel movement: Yes Type of bowel movement:Type (Bristol Stool Scale) 4, Frequency every few days (has always been the case), Strain Yes, and Splinting no Fully empty rectum: yes Leakage: No Pads: No Fiber supplement: Yes: iron pills   URINATION: Pain with urination: No Fully empty bladder: yes Stream: Strong Urgency: No Frequency: WNL Leakage: Coughing, Sneezing, Laughing, and Exercise Pads: No  INTERCOURSE: Pain with intercourse: During Penetration and Pain Interrupts  Intercourse Ability to have vaginal penetration:  yes Climax: yes Marinoff Scale: 2/3  PREGNANCY: Vaginal deliveries 1  Tearing Yes: 2nd degree Currently pregnant No  PROLAPSE: None   OBJECTIVE:  Note: Objective measures were completed at Evaluation unless otherwise noted.  DIAGNOSTIC FINDINGS:  N/A  PATIENT SURVEYS:  PFIQ-7: 14 POPIQ-7: 14 12/30/23: POPIQ-7 = 10  COGNITION: Overall cognitive status: Within functional limits for tasks assessed     SENSATION: Light touch: Appears intact Proprioception: Appears intact  LUMBAR SPECIAL TESTS:  Single leg stance test: Positive  FUNCTIONAL TESTS:  Squat: mild dynamic knee valgus with sit to stand transfer  GAIT: Mild trendelenburg gait pattern with ambulation  POSTURE: No Significant postural limitations  PELVIC ALIGNMENT: WNL  LUMBARAROM/PROM:  A/PROM A/PROM  eval  Flexion WNL  Extension WNL  Right lateral flexion 25% limited  Left lateral flexion 25% limited  Right rotation 25% limited  Left rotation 25% limited   (Blank rows = not tested)  LOWER EXTREMITY ROM:  WNL  LOWER EXTREMITY MMT: WNL unless stated otherwise   MMT Right eval Left eval Reassessment 12/30/23  Hip flexion 5/5 5/5 5/5 bilateral   Hip extension 4/5 4/5 5/5 bilateral   Hip abduction 4/5 4/5 4/5, 4/5   Hip adduction 4/5 4/5 5/5 bilateral    PALPATION:   General  no tenderness to palpation in adductors/pubic bone/perineum                External Perineal Exam: no significant dryness or muscle tension noted                             Internal Pelvic Floor: mild overactivity noted in deep pelvic floor bilaterally, no pain with palpation of deep or superficial musculature, unable to fully lengthen pelvic floor musculature   Patient confirms identification and approves PT to assess internal pelvic floor and treatment Yes  PELVIC MMT:   MMT eval 11/25/23 12/30/23  Vaginal 4/5, 10 quick flicks, 10 second hold 4/5 4/5 with no pain during vaginal examination.  Diastasis Recti WNL    (Blank rows = not tested)       TONE: Mild muscle overactivity in deep pelvic floor bilaterally   PROLAPSE: Anterior vaginal wall laxity with cough test    TODAY'S TREATMENT:     12/16/23 Manual:  Myofascial release: Release of the urogenital diaphragm gong through the different layers Trigger Point Dry Needling Subsequent Treatment: Instructions provided previously at initial dry needling treatment.  Patient Verbal Consent Given: Yes Education Handout Provided: Previously Provided Muscles Treated: gluteus max, gluteus med, lumbar multifidi bilaterally  Electrical Stimulation Performed: No Treatment Response/Outcome: decreased trigger point palpation in glutes, lower back pain decreased to 3/10 Internal pelvic floor techniques: No emotional/communication barriers or cognitive limitation. Patient is motivated to learn. Patient understands and agrees with treatment goals and plan. PT explains patient will be examined in standing, sitting, and lying down to see how their muscles and joints work.  When they are ready, they will be asked to remove their underwear so PT can examine their perineum. The patient is also given the option of providing their own chaperone as one is not provided in our facility. The patient also has the right and is explained the right to defer or refuse any part of the evaluation or treatment including the internal exam. With the patient's consent, PT will use one gloved finger to gently assess the muscles of the pelvic floor,  seeing how well it contracts and relaxes and if there is muscle symmetry. After, the patient will get dressed and PT and patient will discuss exam findings and plan of care. PT and patient discuss plan of care, schedule, attendance policy and HEP activities.  Going through the vaginal canal working on the levator ani then working on releasing around the cervix, fallopian tube, and sides of the bladder  12/09/23 Manual:  Myofascial release: Release of the urogenital diaphragm gong through the different layers Spinal soft tissue mobilization: bilateral quadratus lumborum, lumbar paraspinals, upper gluteus max Internal pelvic floor techniques: No emotional/communication barriers or cognitive limitation. Patient is motivated to learn. Patient understands and agrees with treatment goals and plan. PT explains patient will be examined in standing, sitting, and lying down to see how their muscles and joints work. When they are ready, they will be asked to remove their underwear so PT can examine their perineum. The patient is also given the option of providing their own chaperone as one is not provided in our facility. The patient also has the right and is explained the right to defer or refuse any part of the evaluation or treatment including the internal exam. With the patient's consent, PT will use one gloved finger to gently assess the muscles of the pelvic floor, seeing how well it contracts and relaxes and if there is muscle symmetry. After, the patient  will get dressed and PT and patient will discuss exam findings and plan of care. PT and patient discuss plan of care, schedule, attendance policy and HEP activities.  Going through the vaginal canal working on the levator ani then working on releasing around the cervix, fallopian tube, and sides of the bladder  11/25/23 Manual: Myofascial release: Release of the urogenital diaphragm gong through the different layers Spinal mobilization: Internal pelvic floor techniques: No emotional/communication barriers or cognitive limitation. Patient is motivated to learn. Patient understands and agrees with treatment goals and plan. PT explains patient will be examined in standing, sitting, and lying down to see how their muscles and joints work. When they are ready, they will be asked to remove their underwear so PT can examine their perineum. The patient is also given the option of providing their own chaperone as one is not provided in our facility. The patient also has the right and is explained the right to defer or refuse any part of the evaluation or treatment including the internal exam. With the patient's consent, PT will use one gloved finger to gently assess the muscles of the pelvic floor, seeing how well it contracts and relaxes and if there is muscle symmetry. After, the patient will get dressed and PT and patient will discuss exam findings and plan of care. PT and patient discuss plan of care, schedule, attendance policy and HEP activities.  Going through the vaginal canal working on the levator ani then working on releasing around the cervix, fallopian tube, and sides of the bladder Exercises: Stretches/mobility: Piriformis stretch holding 30 sec sitting Sitting hip adductor stretch holding 30 sec Sit like a z and lift the leg into internal rotation then lift hips in the air Hip adductor stretch in squat position with rotating foot in and out then rocking hips forward and back   PATIENT  EDUCATION:  11/18/23 Education details: Access Code: [email or web address], information on dry needling Person educated: Patient Education method: Explanation, Demonstration, Tactile cues, Verbal cues, and Handouts Education comprehension: verbalized understanding, returned demonstration, verbal cues required, and tactile cues required  HOME EXERCISE PROGRAM: 11/18/23 Access Code: [email or web address] URL: [email or web address] Date: 11/18/2023 Prepared by: Eulis Foster  Exercises - Supine Diaphragmatic Breathing  - 1 x daily - 7 x weekly - 3 sets - 10 reps - Child's Pose Stretch  - 1 x daily - 7 x weekly - 2 sets - hold - Sidelying Thoracic Rotation with Open Book  - 1 x daily - 7 x weekly - 1 sets - 10 reps - Half Kneeling Hip Flexor Stretch with Sidebend  - 1 x daily - 7 x weekly - 1 sets - 1 reps - 30 sec hold - Hooklying Transversus Abdominis Palpation  - 1 x daily - 7 x weekly - 1 sets - 10 reps - Hooklying Isometric Hip Flexion  - 1 x daily - 7 x weekly - 1 sets - 10 reps - Piriformis Mobilization on Foam Roll  - 1 x daily - 7 x weekly - 3 sets - 10 reps - Thoracic Stretch on Foam Roll - Hands Clasped  - 1 x daily - 7 x weekly - 3 sets - 10 reps  ASSESSMENT:  CLINICAL IMPRESSION: Patient is a 33 y.o. female who was seen today for physical therapy  treatment for deep pelvic pain during intercourse (5-6/10). Patient reports her back pain is 60% better, with 5/10 pain at rest today. Patient is pleased to report no urinary leakage this week. Internal treatment provided today to decrease pelvic floor muscle tension and lumbar muscle tension bilaterally. She has increased mobility of the cervix, fallopian tube and sides of the bladder after the manual work, along with decreased muscle tension in left pelvic floor musculature. Dry needling performed to glutes and lumbar multifidi to decrease overall lumbopelvic tension. Overall, patient tolerated treatment well and will benefit from skilled  intervention to address pelvic floor coordination, control, strength, and urinary incontinence.  OBJECTIVE IMPAIRMENTS: decreased coordination, decreased endurance, decreased mobility, decreased ROM, decreased strength, and pain.   ACTIVITY LIMITATIONS: continence  PARTICIPATION LIMITATIONS:  vaginal penetration  PERSONAL FACTORS: Past/current experiences and Time since onset of injury/illness/exacerbation are also affecting patient's functional outcome.   REHAB POTENTIAL: Good  CLINICAL DECISION MAKING: Stable/uncomplicated  EVALUATION COMPLEXITY: Low   GOALS: Goals reviewed with patient? Yes  SHORT TERM GOALS: Target date: 05/03/24 Patient to report independence with HEP to decrease pelvic pain and improve quality of life. Baseline:  Goal status: Met 11/17/13  2.  Patient to demonstrate decreased POPIQ-7 score of < or = to 7 points to suggest decreased functional limitations secondary to pelvic pain to improve quality of life.  Baseline: 10 points  Goal status: ONGOING   LONG TERM GOALS: Target date: 05/03/24  Patient to report independence with advanced HEP to decrease pelvic pain and improve quality of life.  Baseline: not instructed yet Goal status: GOAL MET 12/30/23  2.  Patient will report little to no (1-2/10) pain with penile penetration vaginally to improve QOL. Baseline: pain level 10/10 Goal status: GOAL MET 12/30/23  3.  Patient will demonstrate full pelvic floor muscle A/ROM to decrease urinary leakage with sneezing/coughing/jumping and improve quality of life.  Baseline: 50% limited  Goal status: GOAL MET 12/30/23  4.  Patient will report little to no (1-2/10) low back pain at rest to improve QOL and allow patient to complete ADLs/work at home.  Baseline: pain level 5/10 Goal status: ONGOING  PLAN:  PT FREQUENCY: 1x/week  PT DURATION: 6 months  PLANNED INTERVENTIONS: 97110-Therapeutic exercises, 97530- Therapeutic activity, [id number]- Neuromuscular  re-education,  [id number]- Self Care, [id number]- Manual therapy, Taping, Dry Needling, Scar mobilization, Cryotherapy, and Moist heat  PLAN FOR NEXT SESSION: dry needling for back and deep pelvic floor muscles if needed, internal manual muscle release, introduce activation of pelvic floor with breathing, introduce hip strengthening, continue lumbopelvic mobility   Earna Coder, PT, DPT 01/12/24 1:03 PM

## 2024-01-13 ENCOUNTER — Ambulatory Visit: Payer: Self-pay | Attending: Physician Assistant | Admitting: Physical Therapy

## 2024-01-20 ENCOUNTER — Ambulatory Visit: Payer: Self-pay | Attending: Physician Assistant | Admitting: Physical Therapy

## 2024-01-20 DIAGNOSIS — R279 Unspecified lack of coordination: Secondary | ICD-10-CM | POA: Diagnosis present

## 2024-01-20 DIAGNOSIS — N393 Stress incontinence (female) (male): Secondary | ICD-10-CM | POA: Diagnosis present

## 2024-01-20 DIAGNOSIS — M6281 Muscle weakness (generalized): Secondary | ICD-10-CM | POA: Diagnosis present

## 2024-01-20 NOTE — Therapy (Signed)
 OUTPATIENT PHYSICAL THERAPY FEMALE PELVIC TREATMENT   Patient Name: Sharon Khan MRN: [id number] DOB:Mar 05, 1991, 33 y.o., female Today's Date: 01/20/2024  END OF SESSION:  PT End of Session - 01/20/24 1317     Visit Number 8    Date for PT Re-Evaluation 05/03/24    Authorization Type Medicaid    Authorization Time Period 11/04/23-01/03/24    Authorization - Number of Visits 4    PT Start Time 1230    PT Stop Time 1315    PT Time Calculation (min) 45 min    Activity Tolerance Patient tolerated treatment well    Behavior During Therapy [facility] for tasks assessed/performed               Past Medical History:  Diagnosis Date   Anemia    Vaginal Pap smear, abnormal    Past Surgical History:  Procedure Laterality Date   BREAST LUMPECTOMY  2012   Patient Active Problem List   Diagnosis Date Noted   IDA (iron deficiency anemia) 11/27/2021   Pre-eclampsia in third trimester 11/26/2021   SVD (spontaneous vaginal delivery) 11/26/2021   Thrombocytopenia affecting pregnancy (HCC) 11/25/2021    PCP: none  REFERRING PROVIDER: Barnie Mort, PA-C   REFERRING DIAG: N94.10 (ICD-10-CM) - Unspecified dyspareunia   THERAPY DIAG:  Muscle weakness (generalized)  Unspecified lack of coordination  Stress incontinence (female) (female)  Rationale for Evaluation and Treatment: Rehabilitation  ONSET DATE: 2007  SUBJECTIVE:                                                                                                                                                                                           SUBJECTIVE STATEMENT: Patient reports that her back is a 10/10 pain today - she is feeling muscle tension and soreness due to period coming soon. She is spotting a lot outside of her period due to IUD.   PAIN:  Are you having pain? Yes: NPRS scale: 6 Pain location: low back to the low thoracic Pain description: aching shooting Aggravating factors: constant Relieving factors:  stretch  PAIN:   Reassessment 12/30/23: NPRS scale: 5/10   Eval: Are you having pain? Yes NPRS scale: 5-6/10 Pain location: Internal and Deep  Pain type: aching and sharp Pain description: intermittent   Aggravating factors: vaginal intercourse, position dependent  Relieving factors: positions where she is in control of depth   PRECAUTIONS: None  RED FLAGS: None   WEIGHT BEARING RESTRICTIONS: No  FALLS:  Has patient fallen in last 6 months? No  LIVING ENVIRONMENT: Lives with: lives with their family Lives in: House/apartment  OCCUPATION: stay at home mom  PLOF: Independent  PATIENT GOALS: decrease pain during intercourse, increase lumbopelvic strength   PERTINENT HISTORY:  Breast Lumpectomy Sexual abuse: No  BOWEL MOVEMENT: Pain with bowel movement: Yes Type of bowel movement:Type (Bristol Stool Scale) 4, Frequency every few days (has always been the case), Strain Yes, and Splinting no Fully empty rectum: yes Leakage: No Pads: No Fiber supplement: Yes: iron pills   URINATION: Pain with urination: No Fully empty bladder: yes Stream: Strong Urgency: No Frequency: WNL Leakage: Coughing, Sneezing, Laughing, and Exercise Pads: No  INTERCOURSE: Pain with intercourse: During Penetration and Pain Interrupts Intercourse Ability to have vaginal penetration:  yes Climax: yes Marinoff Scale: 2/3  PREGNANCY: Vaginal deliveries 1  Tearing Yes: 2nd degree Currently pregnant No  PROLAPSE: None   OBJECTIVE:  Note: Objective measures were completed at Evaluation unless otherwise noted.  DIAGNOSTIC FINDINGS:  N/A  PATIENT SURVEYS:  PFIQ-7: 14 POPIQ-7: 14 12/30/23: POPIQ-7 = 10  COGNITION: Overall cognitive status: Within functional limits for tasks assessed     SENSATION: Light touch: Appears intact Proprioception: Appears intact  LUMBAR SPECIAL TESTS:  Single leg stance test: Positive  FUNCTIONAL TESTS:  Squat: mild dynamic knee valgus with  sit to stand transfer  GAIT: Mild trendelenburg gait pattern with ambulation  POSTURE: No Significant postural limitations  PELVIC ALIGNMENT: WNL  LUMBARAROM/PROM:  A/PROM A/PROM  eval  Flexion WNL  Extension WNL  Right lateral flexion 25% limited  Left lateral flexion 25% limited  Right rotation 25% limited  Left rotation 25% limited   (Blank rows = not tested)  LOWER EXTREMITY ROM: WNL  LOWER EXTREMITY MMT: WNL unless stated otherwise   MMT Right eval Left eval Reassessment 12/30/23  Hip flexion 5/5 5/5 5/5 bilateral   Hip extension 4/5 4/5 5/5 bilateral   Hip abduction 4/5 4/5 4/5, 4/5   Hip adduction 4/5 4/5 5/5 bilateral    PALPATION:   General  no tenderness to palpation in adductors/pubic bone/perineum                External Perineal Exam: no significant dryness or muscle tension noted                             Internal Pelvic Floor: mild overactivity noted in deep pelvic floor bilaterally, no pain with palpation of deep or superficial musculature, unable to fully lengthen pelvic floor musculature   Patient confirms identification and approves PT to assess internal pelvic floor and treatment Yes  PELVIC MMT:   MMT eval 11/25/23 12/30/23  Vaginal 4/5, 10 quick flicks, 10 second hold 4/5 4/5 with no pain during vaginal examination.  Diastasis Recti WNL    (Blank rows = not tested)       TONE: Mild muscle overactivity in deep pelvic floor bilaterally   PROLAPSE: Anterior vaginal wall laxity with cough test    TODAY'S TREATMENT:     01/20/24: Manual:  Myofascial release: Release of the urogenital diaphragm gong through the different layers Trigger Point Dry Needling Subsequent Treatment: Instructions provided previously at initial dry needling treatment.  Patient Verbal Consent Given: Yes Education Handout Provided: Previously Provided Muscles Treated: gluteus max, gluteus med, lumbar multifidi bilaterally, piriformis bilateral  Electrical  Stimulation Performed: No Treatment Response/Outcome: decreased trigger point palpation in glutes, lower back pain decreased to 6/10 Neuro Re-ed: Cat/cow + diaphragmatic breathing 2x10  Cobra stretch + diaphragmatic breathing 2x68min  Childs pose + diaphragmatic breathing 2x10   12/16/23 Manual:  Myofascial release: Release of the urogenital diaphragm gong through the different layers Trigger Point Dry Needling Subsequent Treatment: Instructions provided previously at initial dry needling treatment.  Patient Verbal Consent Given: Yes Education Handout Provided: Previously Provided Muscles Treated: gluteus max, gluteus med, lumbar multifidi bilaterally  Electrical Stimulation Performed: No Treatment Response/Outcome: decreased trigger point palpation in glutes, lower back pain decreased to 3/10 Internal pelvic floor techniques: No emotional/communication barriers or cognitive limitation. Patient is motivated to learn. Patient understands and agrees with treatment goals and plan. PT explains patient will be examined in standing, sitting, and lying down to see how their muscles and joints work. When they are ready, they will be asked to remove their underwear so PT can examine their perineum. The patient is also given the option of providing their own chaperone as one is not provided in our facility. The patient also has the right and is explained the right to defer or refuse any part of the evaluation or treatment including the internal exam. With the patient's consent, PT will use one gloved finger to gently assess the muscles of the pelvic floor, seeing how well it contracts and relaxes and if there is muscle symmetry. After, the patient will get dressed and PT and patient will discuss exam findings and plan of care. PT and patient discuss plan of care, schedule, attendance policy and HEP activities.  Going through the vaginal canal working on the levator ani then working on releasing around the  cervix, fallopian tube, and sides of the bladder  12/09/23 Manual:  Myofascial release: Release of the urogenital diaphragm gong through the different layers Spinal soft tissue mobilization: bilateral quadratus lumborum, lumbar paraspinals, upper gluteus max Internal pelvic floor techniques: No emotional/communication barriers or cognitive limitation. Patient is motivated to learn. Patient understands and agrees with treatment goals and plan. PT explains patient will be examined in standing, sitting, and lying down to see how their muscles and joints work. When they are ready, they will be asked to remove their underwear so PT can examine their perineum. The patient is also given the option of providing their own chaperone as one is not provided in our facility. The patient also has the right and is explained the right to defer or refuse any part of the evaluation or treatment including the internal exam. With the patient's consent, PT will use one gloved finger to gently assess the muscles of the pelvic floor, seeing how well it contracts and relaxes and if there is muscle symmetry. After, the patient will get dressed and PT and patient will discuss exam findings and plan of care. PT and patient discuss plan of care, schedule, attendance policy and HEP activities.  Going through the vaginal canal working on the levator ani then working on releasing around the cervix, fallopian tube, and sides of the bladder  PATIENT EDUCATION:  11/18/23 Education details: Access Code: [email or web address], information on dry needling Person educated: Patient Education method: Explanation, Demonstration, Tactile cues, Verbal cues, and Handouts Education comprehension: verbalized understanding, returned demonstration, verbal cues required, and tactile cues required  HOME EXERCISE PROGRAM: Access Code: [email or web address] URL: [email or web address] Date: 01/20/2024 Prepared by: Robbin Chill  Exercises - Supine  Diaphragmatic Breathing  - 1 x daily - 7 x weekly - 2 sets - 10 reps - Child's Pose Stretch  - 1 x daily - 7 x weekly - 2 sets - hold - Sidelying Thoracic Rotation with Open Book  - 1 x daily - 7 x weekly - 2  sets - 10 reps - Abdominal Press into [place]  - 1 x daily - 7 x weekly - 2 sets - 10 reps - Supine Lower Trunk Rotation  - 1 x daily - 7 x weekly - 2 sets - 10 reps - DNS Bug Heel Touches  - 1 x daily - 7 x weekly - 2 sets - 10 reps - Cat Cow  - 1 x daily - 7 x weekly - 2 sets - 10 reps  ASSESSMENT:  CLINICAL IMPRESSION: Patient is a 33 y.o. female who was seen today for physical therapy  treatment for lumbopelvic pain. Patient reports her back pain is 10/10 today, mainly due to a gap in treatment and a stressful week. Patient is pleased to report no urinary leakage this week. Lumbopelvic mobility exercises introduced to decrease general stiffness and lumbar pain. Dry needling performed to glutes and lumbar multifidi to decrease overall lumbopelvic tension. Patient reports decreased pain at end of session, 6/10 overall. Overall, patient tolerated treatment well and will benefit from skilled intervention to address pelvic floor coordination, control, strength, and urinary incontinence.  OBJECTIVE IMPAIRMENTS: decreased coordination, decreased endurance, decreased mobility, decreased ROM, decreased strength, and pain.   ACTIVITY LIMITATIONS: continence  PARTICIPATION LIMITATIONS:  vaginal penetration  PERSONAL FACTORS: Past/current experiences and Time since onset of injury/illness/exacerbation are also affecting patient's functional outcome.   REHAB POTENTIAL: Good  CLINICAL DECISION MAKING: Stable/uncomplicated  EVALUATION COMPLEXITY: Low   GOALS: Goals reviewed with patient? Yes  SHORT TERM GOALS: Target date: 05/03/24 Patient to report independence with HEP to decrease pelvic pain and improve quality of life. Baseline:  Goal status: Met 11/17/13  2.  Patient to demonstrate  decreased POPIQ-7 score of < or = to 7 points to suggest decreased functional limitations secondary to pelvic pain to improve quality of life.  Baseline: 10 points  Goal status: ONGOING   LONG TERM GOALS: Target date: 05/03/24  Patient to report independence with advanced HEP to decrease pelvic pain and improve quality of life.  Baseline: not instructed yet Goal status: GOAL MET 12/30/23  2.  Patient will report little to no (1-2/10) pain with penile penetration vaginally to improve QOL. Baseline: pain level 10/10 Goal status: GOAL MET 12/30/23  3.  Patient will demonstrate full pelvic floor muscle A/ROM to decrease urinary leakage with sneezing/coughing/jumping and improve quality of life.  Baseline: 50% limited  Goal status: GOAL MET 12/30/23  4.  Patient will report little to no (1-2/10) low back pain at rest to improve QOL and allow patient to complete ADLs/work at home.  Baseline: pain level 5/10 Goal status: ONGOING  PLAN:  PT FREQUENCY: 1x/week  PT DURATION: 6 months  PLANNED INTERVENTIONS: 97110-Therapeutic exercises, 97530- Therapeutic activity, 97112- Neuromuscular re-education, 97535- Self Care, [id number]- Manual therapy, Taping, Dry Needling, Scar mobilization, Cryotherapy, and Moist heat  PLAN FOR NEXT SESSION: dry needling for back and deep pelvic floor muscles if needed, internal manual muscle release, introduce activation of pelvic floor with breathing, introduce hip strengthening, continue lumbopelvic mobility   Robbin Chill, PT, DPT 01/20/24 1:18 PM

## 2024-02-03 ENCOUNTER — Ambulatory Visit: Admitting: Physical Therapy

## 2024-02-11 ENCOUNTER — Encounter: Admitting: Physical Therapy

## 2024-02-18 ENCOUNTER — Ambulatory Visit: Attending: Physician Assistant | Admitting: Physical Therapy

## 2024-02-18 DIAGNOSIS — N393 Stress incontinence (female) (male): Secondary | ICD-10-CM | POA: Diagnosis present

## 2024-02-18 DIAGNOSIS — M6281 Muscle weakness (generalized): Secondary | ICD-10-CM | POA: Insufficient documentation

## 2024-02-18 DIAGNOSIS — R279 Unspecified lack of coordination: Secondary | ICD-10-CM | POA: Insufficient documentation

## 2024-02-18 NOTE — Therapy (Signed)
 OUTPATIENT PHYSICAL THERAPY FEMALE PELVIC TREATMENT   Patient Name: Sharon Khan MRN: [id number] DOB:June 21, 1991, 33 y.o., female Today's Date: 02/18/2024  END OF SESSION:  PT End of Session - 02/18/24 1244     Visit Number 9    Date for PT Re-Evaluation 05/03/24    Authorization Type Medicaid    Authorization Time Period 11/04/23-01/03/24    Authorization - Number of Visits 4    PT Start Time 1145    PT Stop Time 1230    PT Time Calculation (min) 45 min    Activity Tolerance Patient tolerated treatment well    Behavior During Therapy [facility] for tasks assessed/performed                Past Medical History:  Diagnosis Date   Anemia    Vaginal Pap smear, abnormal    Past Surgical History:  Procedure Laterality Date   BREAST LUMPECTOMY  2012   Patient Active Problem List   Diagnosis Date Noted   IDA (iron deficiency anemia) 11/27/2021   Pre-eclampsia in third trimester 11/26/2021   SVD (spontaneous vaginal delivery) 11/26/2021   Thrombocytopenia affecting pregnancy (HCC) 11/25/2021    PCP: none  REFERRING PROVIDER: Elwyn Hamper, PA-C   REFERRING DIAG: N94.10 (ICD-10-CM) - Unspecified dyspareunia   THERAPY DIAG:  Muscle weakness (generalized)  Unspecified lack of coordination  Stress incontinence (female) (female)  Rationale for Evaluation and Treatment: Rehabilitation  ONSET DATE: 2007  SUBJECTIVE:                                                                                                                                                                                           SUBJECTIVE STATEMENT: Patient reports that her back is a 10/10 pain today - the last two days she has been constipated. Stool has been hard. She is supposed to get her period soon, IUD is going well.   PAIN:  Are you having pain? Yes: NPRS scale: 6 Pain location: low back to the low thoracic Pain description: aching shooting Aggravating factors: constant Relieving factors:  stretch  PAIN:  02/18/24: 10/10 lumbopelvic pain   Reassessment 12/30/23: NPRS scale: 5/10   Eval: Are you having pain? Yes NPRS scale: 5-6/10 Pain location: Internal and Deep  Pain type: aching and sharp Pain description: intermittent   Aggravating factors: vaginal intercourse, position dependent  Relieving factors: positions where she is in control of depth   PRECAUTIONS: None  RED FLAGS: None   WEIGHT BEARING RESTRICTIONS: No  FALLS:  Has patient fallen in last 6 months? No  LIVING ENVIRONMENT: Lives with: lives with their family Lives in: House/apartment  OCCUPATION: stay at home mom   PLOF: Independent  PATIENT GOALS: decrease pain during intercourse, increase lumbopelvic strength   PERTINENT HISTORY:  Breast Lumpectomy Sexual abuse: No  BOWEL MOVEMENT: Pain with bowel movement: Yes Type of bowel movement:Type (Bristol Stool Scale) 4, Frequency every few days (has always been the case), Strain Yes, and Splinting no Fully empty rectum: yes Leakage: No Pads: No Fiber supplement: Yes: iron pills   URINATION: Pain with urination: No Fully empty bladder: yes Stream: Strong Urgency: No Frequency: WNL Leakage: Coughing, Sneezing, Laughing, and Exercise Pads: No  INTERCOURSE: Pain with intercourse: During Penetration and Pain Interrupts Intercourse Ability to have vaginal penetration:  yes Climax: yes Marinoff Scale: 2/3  PREGNANCY: Vaginal deliveries 1  Tearing Yes: 2nd degree Currently pregnant No  PROLAPSE: None   OBJECTIVE:  Note: Objective measures were completed at Evaluation unless otherwise noted.  DIAGNOSTIC FINDINGS:  N/A  PATIENT SURVEYS:  PFIQ-7: 14 POPIQ-7: 14 12/30/23: POPIQ-7 = 10  COGNITION: Overall cognitive status: Within functional limits for tasks assessed     SENSATION: Light touch: Appears intact Proprioception: Appears intact  LUMBAR SPECIAL TESTS:  Single leg stance test: Positive  FUNCTIONAL TESTS:   Squat: mild dynamic knee valgus with sit to stand transfer  GAIT: Mild trendelenburg gait pattern with ambulation  POSTURE: No Significant postural limitations  PELVIC ALIGNMENT: WNL  LUMBARAROM/PROM:  A/PROM A/PROM  eval  Flexion WNL  Extension WNL  Right lateral flexion 25% limited  Left lateral flexion 25% limited  Right rotation 25% limited  Left rotation 25% limited   (Blank rows = not tested)  LOWER EXTREMITY ROM: WNL  LOWER EXTREMITY MMT: WNL unless stated otherwise   MMT Right eval Left eval Reassessment 12/30/23  Hip flexion 5/5 5/5 5/5 bilateral   Hip extension 4/5 4/5 5/5 bilateral   Hip abduction 4/5 4/5 4/5, 4/5   Hip adduction 4/5 4/5 5/5 bilateral    PALPATION:   General  no tenderness to palpation in adductors/pubic bone/perineum                External Perineal Exam: no significant dryness or muscle tension noted                             Internal Pelvic Floor: mild overactivity noted in deep pelvic floor bilaterally, no pain with palpation of deep or superficial musculature, unable to fully lengthen pelvic floor musculature   Patient confirms identification and approves PT to assess internal pelvic floor and treatment Yes  PELVIC MMT:   MMT eval 11/25/23 12/30/23  Vaginal 4/5, 10 quick flicks, 10 second hold 4/5 4/5 with no pain during vaginal examination.  Diastasis Recti WNL    (Blank rows = not tested)       TONE: Mild muscle overactivity in deep pelvic floor bilaterally   PROLAPSE: Anterior vaginal wall laxity with cough test    TODAY'S TREATMENT:     02/18/24: Manual:  Myofascial release: Trigger Point Dry Needling Subsequent Treatment: Instructions provided previously at initial dry needling treatment.  Patient Verbal Consent Given: Yes Education Handout Provided: Previously Provided Muscles Treated: gluteus max, gluteus med, lumbar multifidi bilaterally, piriformis bilateral  Electrical Stimulation Performed: No Treatment  Response/Outcome: decreased trigger point palpation in glutes, lower back pain decreased to 6/10\ Abdominal massage to decrease stool bulkage and discomfort in right side of abdomen  Neuro Re-ed: Cat/cow + diaphragmatic breathing 2x10  Cobra stretch + diaphragmatic breathing 2x69min  Earline Glenn pose + diaphragmatic breathing 2x10  Self care  Pelvic floor wand education: where to purchase, how to use, lubricant education, technique and frequency How to perform self abdominal massage at home for constipation management and abdominal discomfort management   01/20/24: Manual:  Myofascial release: Release of the urogenital diaphragm gong through the different layers Trigger Point Dry Needling Subsequent Treatment: Instructions provided previously at initial dry needling treatment.  Patient Verbal Consent Given: Yes Education Handout Provided: Previously Provided Muscles Treated: gluteus max, gluteus med, lumbar multifidi bilaterally, piriformis bilateral  Electrical Stimulation Performed: No Treatment Response/Outcome: decreased trigger point palpation in glutes, lower back pain decreased to 6/10 Neuro Re-ed: Cat/cow + diaphragmatic breathing 2x10  Cobra stretch + diaphragmatic breathing 2x66min  Childs pose + diaphragmatic breathing 2x10   PATIENT EDUCATION:  11/18/23 Education details: Access Code: [email or web address], information on dry needling Person educated: Patient Education method: Explanation, Demonstration, Tactile cues, Verbal cues, and Handouts Education comprehension: verbalized understanding, returned demonstration, verbal cues required, and tactile cues required  HOME EXERCISE PROGRAM: Access Code: [email or web address] URL: [email or web address] Date: 01/20/2024 Prepared by: Robbin Chill  Exercises - Supine Diaphragmatic Breathing  - 1 x daily - 7 x weekly - 2 sets - 10 reps - Child's Pose Stretch  - 1 x daily - 7 x weekly - 2 sets - hold - Sidelying Thoracic Rotation with  Open Book  - 1 x daily - 7 x weekly - 2 sets - 10 reps - Abdominal Press into [place]  - 1 x daily - 7 x weekly - 2 sets - 10 reps - Supine Lower Trunk Rotation  - 1 x daily - 7 x weekly - 2 sets - 10 reps - DNS Bug Heel Touches  - 1 x daily - 7 x weekly - 2 sets - 10 reps - Cat Cow  - 1 x daily - 7 x weekly - 2 sets - 10 reps  ASSESSMENT:  CLINICAL IMPRESSION: Patient is a 33 y.o. female who was seen today for physical therapy  treatment for lumbopelvic pain. Patient reports her back pain is 10/10 today and she has been constipated, having a difficult time passing hard stool. Patient is pleased to report no urinary leakage this week. Abdominal massage performed and patient demonstrates significant tenderness and trigger point palpation in right lower abdominal quadrant. Dry needling performed to glutes and lumbar multifidi to decrease overall lumbopelvic tension. Patient reports decreased pain at end of session, 5/10 overall. Patient educated on pelvic floor wand utilization for pain management in the future, will consider purchasing. Overall, patient tolerated treatment well and will benefit from skilled intervention to address pelvic floor coordination, control, strength, and urinary incontinence.  OBJECTIVE IMPAIRMENTS: decreased coordination, decreased endurance, decreased mobility, decreased ROM, decreased strength, and pain.   ACTIVITY LIMITATIONS: continence  PARTICIPATION LIMITATIONS: vaginal penetration  PERSONAL FACTORS: Past/current experiences and Time since onset of injury/illness/exacerbation are also affecting patient's functional outcome.   REHAB POTENTIAL: Good  CLINICAL DECISION MAKING: Stable/uncomplicated  EVALUATION COMPLEXITY: Low   GOALS: Goals reviewed with patient? Yes  SHORT TERM GOALS: Target date: 05/03/24 Patient to report independence with HEP to decrease pelvic pain and improve quality of life. Baseline:  Goal status: Met 11/17/13  2.  Patient to  demonstrate decreased POPIQ-7 score of < or = to 7 points to suggest decreased functional limitations secondary to pelvic pain to improve quality of life.  Baseline: 10 points  Goal status: ONGOING   LONG TERM GOALS: Target date: 05/03/24  Patient to report independence with advanced HEP to decrease pelvic pain and improve quality of life.  Baseline: not instructed yet Goal status: GOAL MET 12/30/23  2.  Patient will report little to no (1-2/10) pain with penile penetration vaginally to improve QOL. Baseline: pain level 10/10 Goal status: GOAL MET 12/30/23  3.  Patient will demonstrate full pelvic floor muscle A/ROM to decrease urinary leakage with sneezing/coughing/jumping and improve quality of life.  Baseline: 50% limited  Goal status: GOAL MET 12/30/23  4.  Patient will report little to no (1-2/10) low back pain at rest to improve QOL and allow patient to complete ADLs/work at home.  Baseline: pain level 5/10 Goal status: ONGOING  PLAN:  PT FREQUENCY: 1x/week  PT DURATION: 6 months  PLANNED INTERVENTIONS: 97110-Therapeutic exercises, 97530- Therapeutic activity, 97112- Neuromuscular re-education, 97535- Self Care, [id number]- Manual therapy, Taping, Dry Needling, Scar mobilization, Cryotherapy, and Moist heat  PLAN FOR NEXT SESSION: dry needling for back and deep pelvic floor muscles if needed, internal manual muscle release, introduce activation of pelvic floor with breathing, introduce hip strengthening, continue lumbopelvic mobility   Robbin Chill, PT, DPT 02/18/24 12:46 PM

## 2024-02-25 ENCOUNTER — Ambulatory Visit: Admitting: Physical Therapy

## 2024-03-03 ENCOUNTER — Ambulatory Visit: Admitting: Physical Therapy

## 2024-06-15 NOTE — Telephone Encounter (Signed)
 No answer, left message regarding future appointments   Celena Domino, PT, DPT 06/15/24 9:29 AM

## 2024-09-22 ENCOUNTER — Ambulatory Visit
Admission: RE | Admit: 2024-09-22 | Discharge: 2024-09-22 | Disposition: A | Discharge status: Home / Self Care | Source: Ambulatory Visit | Attending: Student

## 2024-09-22 ENCOUNTER — Other Ambulatory Visit: Payer: Self-pay

## 2024-09-22 VITALS — BP 95/53 | HR 85 | Temp 97.9°F | Resp 18

## 2024-09-22 DIAGNOSIS — R6889 Other general symptoms and signs: Secondary | ICD-10-CM | POA: Diagnosis not present

## 2024-09-22 LAB — POC COVID19/FLU A&B COMBO
Covid Antigen, POC: NEGATIVE
Influenza A Antigen, POC: NEGATIVE
Influenza B Antigen, POC: NEGATIVE

## 2024-09-22 MED ORDER — ONDANSETRON 4 MG PO TBDP: 4.0000 mg | ORAL_TABLET | Freq: Three times a day (TID) | ORAL | 0 refills | Status: AC | PRN

## 2024-09-22 NOTE — ED Notes (Signed)
 Reviewed work note

## 2024-09-22 NOTE — ED Triage Notes (Signed)
 Symptoms started 2-3 days ago.  Patient has a sore throat, heavy chest, feels like chest is congestion and can hear herself breathing raspy breaths.  Patient has a cough, has a runny nose.  Patient denies coughing up phlegm.  Patient is a smoker  Patient has taken nyquil and dayquil

## 2024-09-22 NOTE — Discharge Instructions (Addendum)
-   Because your daughter has the flu, I think that you also have the flu.  We will treat for this. -Tamiflu is an antiviral medication that reduces how long and how severe the flu is.  Take it twice daily for 5 days.  You can take it with food. -Take the Zofran  (ondansetron ) up to 3 times daily for nausea and vomiting. Dissolve one pill under your tongue or between your teeth and your cheek. -For fevers/chills and bodyaches: -You can take Tylenol  up to 1000 mg 3 times daily, and ibuprofen  up to 600 mg 3 times daily with food.  You can take these together, or alternate every 3-4 hours. -Your cough should slowly get better instead of worse. If you develop a cough productive of dark or red sputum, new shortness of breath, new chest tightness, new fevers, etc - seek additional care. - The flu is a virus, and can last up to 7 days.

## 2024-09-22 NOTE — ED Provider Notes (Signed)
 [facility]    CSN: [id number] Arrival date & time: 09/22/24  0854      History   Chief Complaint Chief Complaint  Patient presents with   Appointment   Cough    HPI Maham Quintin is a 33 y.o. female presenting w sore throat and congestion.  Symptoms started 2-3 days ago.  Patient has a sore throat, heavy chest, feels like chest is congestion and can hear herself breathing raspy breaths.  Patient has a cough, has a runny nose. Describes cough as dry. Patient denies coughing up phlegm.  Denies SOB, DOE.  Patient has taken nyquil and dayquil Current smoker. Has not required inhaler in the past.  HPI  Past Medical History:  Diagnosis Date   Anemia    Vaginal Pap smear, abnormal     Patient Active Problem List   Diagnosis Date Noted   IDA (iron deficiency anemia) 11/27/2021   Pre-eclampsia in third trimester 11/26/2021   SVD (spontaneous vaginal delivery) 11/26/2021   Thrombocytopenia affecting pregnancy 11/25/2021    Past Surgical History:  Procedure Laterality Date   BREAST LUMPECTOMY  2012    OB History     Gravida  2   Para      Term      Preterm      AB  1   Living  0      SAB  1   IAB  0   Ectopic  0   Multiple      Live Births  0            Home Medications    Prior to Admission medications  Medication Sig Start Date End Date Taking? Authorizing Provider  ondansetron  (ZOFRAN -ODT) 4 MG disintegrating tablet Take 1 tablet (4 mg total) by mouth every 8 (eight) hours as needed for nausea or vomiting. 09/22/24  Yes Nole Robey E, PA-C  oseltamivir (TAMIFLU) 75 MG capsule Take 1 capsule (75 mg total) by mouth every 12 (twelve) hours. 09/22/24  Yes Liliann File E, PA-C  ferrous sulfate 325 (65 FE) MG tablet Take 325 mg by mouth daily with breakfast.    [provider]  ibuprofen  (ADVIL ) 600 MG tablet Take 1 tablet (600 mg total) by mouth every 6 (six) hours as needed for cramping or moderate pain. 11/28/21   Pinn,  Walda, MD    Family History History reviewed. No pertinent family history.  Social History Social History[1]   Allergies   Patient has no known allergies.   Review of Systems Review of Systems  Constitutional:  Negative for appetite change, chills and fever.  HENT:  Positive for congestion. Negative for ear pain, rhinorrhea, sinus pressure, sinus pain and sore throat.   Eyes:  Negative for redness and visual disturbance.  Respiratory:  Positive for cough and chest tightness. Negative for shortness of breath and wheezing.   Cardiovascular:  Negative for chest pain and palpitations.  Gastrointestinal:  Negative for abdominal pain, constipation, diarrhea, nausea and vomiting.  Genitourinary:  Negative for dysuria, frequency and urgency.  Musculoskeletal:  Negative for myalgias.  Neurological:  Negative for dizziness, weakness and headaches.  Psychiatric/Behavioral:  Negative for confusion.   All other systems reviewed and are negative.    Physical Exam Triage Vital Signs ED Triage Vitals [09/22/24 0918]  Encounter Vitals Group     BP      Girls Systolic BP Percentile      Girls Diastolic BP Percentile      Boys Systolic  BP Percentile      Boys Diastolic BP Percentile      Pulse      Resp      Temp      Temp src      SpO2      Weight      Height      Head Circumference      Peak Flow      Pain Score 6     Pain Loc      Pain Education      Exclude from Growth Chart    No data found.  Updated Vital Signs BP (!) 95/53 (BP Location: Right Arm)   Pulse 85   Temp 97.9 F (36.6 C) (Oral)   Resp 18   LMP 09/20/2024   SpO2 96%   Visual Acuity Right Eye Distance:   Left Eye Distance:   Bilateral Distance:    Right Eye Near:   Left Eye Near:    Bilateral Near:     Physical Exam Vitals reviewed.  Constitutional:      General: She is not in acute distress.    Appearance: Normal appearance. She is not ill-appearing.  HENT:     Head: Normocephalic and  atraumatic.     Right Ear: Tympanic membrane, ear canal and external ear normal. No tenderness. No middle ear effusion. There is no impacted cerumen. Tympanic membrane is not perforated, erythematous, retracted or bulging.     Left Ear: Tympanic membrane, ear canal and external ear normal. No tenderness.  No middle ear effusion. There is no impacted cerumen. Tympanic membrane is not perforated, erythematous, retracted or bulging.     Nose: Nose normal. No congestion.     Mouth/Throat:     Mouth: Mucous membranes are moist.     Pharynx: Uvula midline. No oropharyngeal exudate or posterior oropharyngeal erythema.     Tonsils: No tonsillar exudate.  Eyes:     Extraocular Movements: Extraocular movements intact.     Pupils: Pupils are equal, round, and reactive to light.  Cardiovascular:     Rate and Rhythm: Normal rate and regular rhythm.     Heart sounds: Normal heart sounds.  Pulmonary:     Effort: Pulmonary effort is normal.     Breath sounds: Normal breath sounds. No decreased breath sounds, wheezing, rhonchi or rales.  Abdominal:     Palpations: Abdomen is soft.     Tenderness: There is no abdominal tenderness. There is no guarding or rebound.  Lymphadenopathy:     Cervical: No cervical adenopathy.     Right cervical: No superficial, deep or posterior cervical adenopathy.    Left cervical: No superficial, deep or posterior cervical adenopathy.  Skin:    Comments: No rash   Neurological:     General: No focal deficit present.     Mental Status: She is alert and oriented to person, place, and time.  Psychiatric:        Mood and Affect: Mood normal.        Behavior: Behavior normal.        Thought Content: Thought content normal.        Judgment: Judgment normal.      UC Treatments / Results  Labs (all labs ordered are listed, but only abnormal results are displayed) Labs Reviewed  POC COVID19/FLU A&B COMBO    EKG   Radiology No results found.  Procedures Procedures  (including critical care time)  Medications Ordered in UC Medications -  No data to display  Initial Impression / Assessment and Plan / UC Course  I have reviewed the triage vital signs and the nursing notes.  Pertinent labs & imaging results that were available during my care of the patient were reviewed by me and considered in my medical decision making (see chart for details).     Patient is a pleasant 33 y.o. female presenting with suspected influenza. The patient is afebrile and nontachycardic.  Antipyretic has not been administered today. IUD contraception. Current smoker.   -Covid negative -Influenza negative-however, her daughter, who is here today, is positive for influenza B.  Following discussion, will manage with Tamiflu.  Zofran  sent to have on hand.  Return precautions as below.  Final Clinical Impressions(s) / UC Diagnoses   Final diagnoses:  Flu-like symptoms     Discharge Instructions      - Because your daughter has the flu, I think that you also have the flu.  We will treat for this. -Tamiflu is an antiviral medication that reduces how long and how severe the flu is.  Take it twice daily for 5 days.  You can take it with food. -Take the Zofran  (ondansetron ) up to 3 times daily for nausea and vomiting. Dissolve one pill under your tongue or between your teeth and your cheek. -For fevers/chills and bodyaches: -You can take Tylenol  up to 1000 mg 3 times daily, and ibuprofen  up to 600 mg 3 times daily with food.  You can take these together, or alternate every 3-4 hours. -Your cough should slowly get better instead of worse. If you develop a cough productive of dark or red sputum, new shortness of breath, new chest tightness, new fevers, etc - seek additional care. - The flu is a virus, and can last up to 7 days.      ED Prescriptions     Medication Sig Dispense Auth. Provider   oseltamivir (TAMIFLU) 75 MG capsule Take 1 capsule (75 mg total) by mouth every 12  (twelve) hours. 10 capsule Audrina Marten E, PA-C   ondansetron  (ZOFRAN -ODT) 4 MG disintegrating tablet Take 1 tablet (4 mg total) by mouth every 8 (eight) hours as needed for nausea or vomiting. 20 tablet Brooklynn Brandenburg E, PA-C      PDMP not reviewed this encounter.     [1]  Social History Tobacco Use   Smoking status: Some Days    Current packs/day: 0.00    Average packs/day: 1 pack/day for 4.0 years (4.0 ttl pk-yrs)    Types: Cigarettes    Start date: 03/06/2015    Last attempt to quit: 03/06/2019    Years since quitting: 5.5    Passive exposure: Past   Smokeless tobacco: Never  Vaping Use   Vaping status: Never Used  Substance Use Topics   Alcohol use: Not Currently    Comment: socially    Drug use: Yes    Frequency: 3.0 times per week    Types: Marijuana    Comment: daril Arlyss Leita FORBES, PA-C 09/22/24 1043
